# Patient Record
Sex: Female | Born: 1970 | Race: White | Hispanic: No | Marital: Married | Smoking: Never smoker
Health system: Southern US, Community
[De-identification: ages and names within clinical notes are randomized; demographics above are authoritative.]

## PROBLEM LIST (undated history)

## (undated) DIAGNOSIS — H04123 Dry eye syndrome of bilateral lacrimal glands: Secondary | ICD-10-CM

## (undated) DIAGNOSIS — L718 Other rosacea: Secondary | ICD-10-CM

## (undated) DIAGNOSIS — T7840XA Allergy, unspecified, initial encounter: Secondary | ICD-10-CM

## (undated) DIAGNOSIS — F329 Major depressive disorder, single episode, unspecified: Secondary | ICD-10-CM

## (undated) DIAGNOSIS — K222 Esophageal obstruction: Secondary | ICD-10-CM

## (undated) DIAGNOSIS — L719 Rosacea, unspecified: Secondary | ICD-10-CM

## (undated) DIAGNOSIS — F32A Depression, unspecified: Secondary | ICD-10-CM

## (undated) DIAGNOSIS — A64 Unspecified sexually transmitted disease: Secondary | ICD-10-CM

## (undated) DIAGNOSIS — C4491 Basal cell carcinoma of skin, unspecified: Secondary | ICD-10-CM

## (undated) HISTORY — DX: Basal cell carcinoma of skin, unspecified: C44.91

## (undated) HISTORY — DX: Rosacea, unspecified: L71.9

## (undated) HISTORY — PX: BASAL CELL CARCINOMA EXCISION: SHX1214

## (undated) HISTORY — DX: Depression, unspecified: F32.A

## (undated) HISTORY — DX: Unspecified sexually transmitted disease: A64

## (undated) HISTORY — DX: Other rosacea: L71.8

## (undated) HISTORY — DX: Esophageal obstruction: K22.2

## (undated) HISTORY — DX: Major depressive disorder, single episode, unspecified: F32.9

## (undated) HISTORY — DX: Dry eye syndrome of bilateral lacrimal glands: H04.123

## (undated) HISTORY — DX: Allergy, unspecified, initial encounter: T78.40XA

## (undated) HISTORY — PX: OTHER SURGICAL HISTORY: SHX169

---

## 1997-02-22 HISTORY — PX: LEEP: SHX91

## 2010-08-25 ENCOUNTER — Ambulatory Visit (INDEPENDENT_AMBULATORY_CARE_PROVIDER_SITE_OTHER): Payer: Commercial Managed Care - PPO | Admitting: Family Medicine

## 2010-08-25 DIAGNOSIS — G2589 Other specified extrapyramidal and movement disorders: Secondary | ICD-10-CM

## 2010-08-25 DIAGNOSIS — S238XXA Sprain of other specified parts of thorax, initial encounter: Secondary | ICD-10-CM

## 2010-08-25 DIAGNOSIS — M24119 Other articular cartilage disorders, unspecified shoulder: Secondary | ICD-10-CM

## 2010-08-25 DIAGNOSIS — M948X9 Other specified disorders of cartilage, unspecified sites: Secondary | ICD-10-CM

## 2010-08-25 DIAGNOSIS — R279 Unspecified lack of coordination: Secondary | ICD-10-CM

## 2010-08-25 DIAGNOSIS — S239XXA Sprain of unspecified parts of thorax, initial encounter: Secondary | ICD-10-CM

## 2010-08-25 NOTE — Progress Notes (Signed)
  Subjective:    Patient ID: Bethany Oliver, female    DOB: [birth year], 40 y.o.   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 But now sx are different and more localized to rhomboids.   States she has flares of pain in bilat trapezius that usually resolve within 2 weeks with massage therapy, icy hot, and rest.  The current flare started after a 6 mile run during which she tried to change her running gait to forefoot strike.  The pain is further down in her left scapula and rhomboid in this exacerbation.  She is not having numbness or tingling in arms, but does describe tingling in left foot.  No low back or buttock pain. Using tylenol and ibuprofen which is helpful if she takes it regularly.    PMH, PSH n/c  Review of Systems  REVIEW OF SYSTEMS  GEN: No fevers, chills. Nontoxic. Primarily MSK c/o today. MSK: Detailed in the HPI GI: tolerating PO intake without difficulty Neuro: No numbness, parasthesias, or tingling associated. Otherwise the pertinent positives of the ROS are noted above.     Objective:   Physical Exam      Physical Exam  Blood pressure 116/82, height 5\' 4"  (1.626 m), weight 125 lb (56.7 kg).  GEN: Well-developed,well-nourished,in no acute distress; alert,appropriate and cooperative throughout examination HEENT: Normocephalic and atraumatic without obvious abnormalities. Ears, externally no deformities PULM: Breathing comfortably in no respiratory distress EXT: No clubbing, cyanosis, or edema PSYCH: Normally interactive. Cooperative during the interview. Pleasant. Friendly and conversant. Not anxious or depressed appearing. Normal, full affect.  CERVICAL SPINE EXAM Range of motion: Flexion, extension, lateral bending, and rotation: Full Pain with terminal motion: none Spinous Processes: NT SCM: NT Upper paracervical muscles: NT Upper traps:  NT C5-T1 intact, sensation and motor  TTP at parascapular edge and rhomboids, L > R Snapping L scapula  Bilat shoulder exam ROM full Hawkins neg Empty can neg ER and IR strong Push off test neg Neer's neg  Spurling's neg   Assessment & Plan:  Assessment and Plan: 1.  Scapular dyskinesis with forward displaced scapula Pain at L > R rhomboids Snapping scapula  Plan: >30 minutes spent in face to face time with patient, >50% spent in counselling or coordination of care: anatomy reviewed. Reviewed scapular stabilization training and rehab for patient. Retrain posture and mechanics  If she is having difficulty managing her rehab, then PT referral is not unreasonable to assist with HEP  Recheck 1 mo

## 2010-08-26 DIAGNOSIS — S238XXA Sprain of other specified parts of thorax, initial encounter: Secondary | ICD-10-CM | POA: Insufficient documentation

## 2010-08-26 DIAGNOSIS — M24119 Other articular cartilage disorders, unspecified shoulder: Secondary | ICD-10-CM | POA: Insufficient documentation

## 2010-08-26 DIAGNOSIS — G2589 Other specified extrapyramidal and movement disorders: Secondary | ICD-10-CM | POA: Insufficient documentation

## 2010-08-26 DIAGNOSIS — M24819 Other specific joint derangements of unspecified shoulder, not elsewhere classified: Secondary | ICD-10-CM | POA: Insufficient documentation

## 2010-09-22 ENCOUNTER — Ambulatory Visit: Payer: Commercial Managed Care - PPO | Admitting: Family Medicine

## 2010-12-03 ENCOUNTER — Ambulatory Visit (INDEPENDENT_AMBULATORY_CARE_PROVIDER_SITE_OTHER): Payer: Commercial Managed Care - PPO | Admitting: Family Medicine

## 2010-12-03 ENCOUNTER — Encounter: Payer: Self-pay | Admitting: Family Medicine

## 2010-12-03 DIAGNOSIS — G2589 Other specified extrapyramidal and movement disorders: Secondary | ICD-10-CM

## 2010-12-03 DIAGNOSIS — R279 Unspecified lack of coordination: Secondary | ICD-10-CM

## 2010-12-03 DIAGNOSIS — S238XXA Sprain of other specified parts of thorax, initial encounter: Secondary | ICD-10-CM

## 2010-12-03 DIAGNOSIS — S239XXA Sprain of unspecified parts of thorax, initial encounter: Secondary | ICD-10-CM

## 2010-12-03 NOTE — Progress Notes (Signed)
  Subjective:    Patient ID: Bethany Oliver, female    DOB: November 19, 1970, 40 y.o.   MRN: [id number]  HPI  Bethany Oliver, a 40 y.o. female presents today in the office for the following:    Pleasant female, 40 year old, f/u with thoracic pain and trap/rhomboid pain with some scapular dysfunction. Did do some of the rehab that we reviewed, felt like she was doing a lot better. Now having some pain with running in this region, but that is better than before. 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   The PMH, PSH, Social History, Family History, Medications, and allergies have been reviewed in [facility], and have been updated if relevant.   Review of Systems REVIEW OF SYSTEMS  GEN: No fevers, chills. Nontoxic. Primarily MSK c/o today. MSK: Detailed in the HPI GI: tolerating PO intake without difficulty Neuro: No numbness, parasthesias, or tingling associated. Otherwise the pertinent positives of the ROS are noted above.      Objective:   Physical Exam   Physical Exam  Blood pressure 113/73, pulse 54.  GEN: Well-developed,well-nourished,in no acute distress; alert,appropriate and cooperative throughout examination HEENT: Normocephalic and atraumatic without obvious abnormalities. Ears, externally no deformities PULM: Breathing comfortably in no respiratory distress EXT: No clubbing, cyanosis, or edema PSYCH: Normally interactive. Cooperative during the interview. Pleasant. Friendly and conversant. Not anxious or depressed appearing. Normal, full affect.  Shoulder: B Inspection: No muscle wasting or winging Ecchymosis/edema: neg  AC joint, scapula, clavicle: NT Abduction: full, 5/5 Flexion: full, 5/5 IR, full, lift-off: 5/5 ER at neutral: full, 5/5 AC crossover and compression: neg Neer: neg Hawkins: neg Drop Test: neg Empty Can: neg Supraspinatus insertion: NT Bicipital groove: NT Speed's: neg Yergason's: neg Sulcus sign: neg Scapular dyskinesis:  none C5-T1 intact Sensation intact Grip 5/5       Assessment & Plan:   1. Scapular dyskinesis   2. Sprain of rhomboid    Improved, but could benefit from formal pt Discussed prop shoulder rehab scap rehab

## 2010-12-21 ENCOUNTER — Ambulatory Visit: Payer: 59 | Attending: Family Medicine

## 2010-12-21 DIAGNOSIS — M542 Cervicalgia: Secondary | ICD-10-CM | POA: Insufficient documentation

## 2010-12-21 DIAGNOSIS — M546 Pain in thoracic spine: Secondary | ICD-10-CM | POA: Insufficient documentation

## 2010-12-21 DIAGNOSIS — IMO0001 Reserved for inherently not codable concepts without codable children: Secondary | ICD-10-CM | POA: Insufficient documentation

## 2010-12-23 ENCOUNTER — Encounter: Payer: Commercial Managed Care - PPO | Admitting: Physical Therapy

## 2010-12-29 ENCOUNTER — Ambulatory Visit: Payer: 59 | Attending: Family Medicine | Admitting: Physical Therapy

## 2010-12-29 DIAGNOSIS — M542 Cervicalgia: Secondary | ICD-10-CM | POA: Insufficient documentation

## 2010-12-29 DIAGNOSIS — M546 Pain in thoracic spine: Secondary | ICD-10-CM | POA: Insufficient documentation

## 2010-12-29 DIAGNOSIS — IMO0001 Reserved for inherently not codable concepts without codable children: Secondary | ICD-10-CM | POA: Insufficient documentation

## 2010-12-31 ENCOUNTER — Ambulatory Visit: Payer: 59 | Admitting: Physical Therapy

## 2011-01-05 ENCOUNTER — Ambulatory Visit: Payer: 59

## 2011-01-06 ENCOUNTER — Ambulatory Visit: Payer: 59 | Admitting: Physical Therapy

## 2011-01-20 ENCOUNTER — Encounter: Payer: Commercial Managed Care - PPO | Admitting: Physical Therapy

## 2011-05-24 HISTORY — PX: LASIK: SHX215

## 2011-08-19 ENCOUNTER — Encounter (HOSPITAL_COMMUNITY): Payer: Self-pay | Admitting: Emergency Medicine

## 2011-08-19 ENCOUNTER — Emergency Department (HOSPITAL_COMMUNITY): Payer: Commercial Managed Care - PPO

## 2011-08-19 ENCOUNTER — Emergency Department (HOSPITAL_COMMUNITY)
Admission: EM | Admit: 2011-08-19 | Discharge: 2011-08-19 | Disposition: A | Discharge status: Home / Self Care | Payer: Commercial Managed Care - PPO | Attending: Emergency Medicine | Admitting: Emergency Medicine

## 2011-08-19 DIAGNOSIS — O039 Complete or unspecified spontaneous abortion without complication: Secondary | ICD-10-CM | POA: Insufficient documentation

## 2011-08-19 LAB — ABO/RH: ABO/RH(D): A POS

## 2011-08-19 LAB — HCG, QUANTITATIVE, PREGNANCY: hCG, Beta Chain, Quant, S: 13748 m[IU]/mL — ABNORMAL HIGH (ref <5)

## 2011-08-19 NOTE — ED Notes (Signed)
Patient transported to Ultrasound 

## 2011-08-19 NOTE — ED Provider Notes (Signed)
History     CSN: [id number]  Arrival date & time 09/05/11  0343   First MD Initiated Contact with Patient 09/05/11 0454      Chief Complaint  Patient presents with  . Vaginal Bleeding    (Consider location/radiation/quality/duration/timing/severity/associated sxs/prior treatment) HPI This is a 41 year old white female approximately [redacted] weeks pregnant. She has a known fetal demise as determined by serial ultrasounds. She is here with vaginal bleeding that began 2 days ago as spotting but became heavier than a regular period about 2:00 this morning. She has also passed a clot and was having severe pelvic cramping accompanying the heavy bleeding. The pain is subsided but he continues to bleed.  History reviewed. No pertinent past medical history.  Past Surgical History  Procedure Date  . Lasik 05/2011    No family history on file.  History  Substance Use Topics  . Smoking status: Never Smoker   . Smokeless tobacco: Never Used  . Alcohol Use: No    OB History    Grav Para Term Preterm Abortions TAB SAB Ect Mult Living                  Review of Systems  All other systems reviewed and are negative.    Allergies  Review of patient's allergies indicates no known allergies.  Home Medications   Current Outpatient Rx  Name Route Sig Dispense Refill  . BUPROPION HCL ER (XL) 150 MG PO TB24 Oral Take 150 mg by mouth daily.      . CYCLOSPORINE 0.05 % OP EMUL Both Eyes Place 1 drop into both eyes 2 (two) times daily.      BP 119/64  Pulse 60  Resp 16  SpO2 100%  LMP 06/14/2011  Physical Exam General: Well-developed, well-nourished female in no acute distress; appearance consistent with age of record HENT: normocephalic, atraumatic Eyes: pupils equal round and reactive to light; extraocular muscles intact Neck: supple Heart: regular rate and rhythm Lungs: clear to auscultation bilaterally Abdomen: soft; nondistended; suprapubic tenderness; no masses or  hepatosplenomegaly; bowel sounds present GU: Normal external genitalia; blood and tissue in vaginal vault; tissue in cervical os which is about fingertip patent; cervical motion tenderness present Extremities: No deformity; full range of motion; pulses normal Neurologic: Awake, alert and oriented; motor function intact in all extremities and symmetric; no facial droop Skin: Warm and dry Psychiatric: Normal mood and affect    ED Course  Procedures (including critical care time)  Apparent products of conception removed from the vagina with ring forceps and sent to laboratory for histopathology.   MDM   Nursing notes and vitals signs, including pulse oximetry, reviewed.  Summary of this visit's results, reviewed by myself:  Labs:  Results for orders placed during the hospital encounter of 09-05-2011  HCG, QUANTITATIVE, PREGNANCY      Component Value Range   hCG, Beta Chain, Quant, S 13748 (*) <5 mIU/mL  ABO/RH      Component Value Range   ABO/RH(D) A POS      Imaging Studies: US Ob Comp Less 14 Wks  09/05/11  *RADIOLOGY REPORT*  Clinical Data: Evaluate for a miscarriage.  OBSTETRIC <14 WK [place] AND TRANSVAGINAL OB US  Technique:  Both transabdominal and transvaginal ultrasound examinations were performed for complete evaluation of the gestation as well as the maternal uterus, adnexal regions, and pelvic cul-de-sac.  Transvaginal technique was performed to assess early pregnancy.  Comparison:  None.  Intrauterine gestational sac:  None Yolk sac:  None Embryo: None  Maternal uterus/adnexae: There is no evidence for an intrauterine gestational sac.  There may be a trace amount of endometrial fluid near the lower uterine segment.  The uterus measures 11.5 x 5.8 x 4.5 cm.  Endometrial stripe measures 1.5 cm.  There is a heterogeneous structure along the anterior uterus that measures 2.1 x 2.0 x 2.2 cm.  Findings are suggestive for a fibroid.  The right ovary has a normal appearance and measures  2.5 x 1.7 x 2.0 cm.  Normal appearance of the left ovary, measuring 3.7 x 2.2 x 2.5 cm.  No evidence of free fluid.  IMPRESSION: No evidence for an intrauterine pregnancy.  Uterine fibroid.  Original Report Authenticated By: Richarda Overlie, M.D.   US Ob Transvaginal  08/19/2011  *RADIOLOGY REPORT*  Clinical Data: Evaluate for a miscarriage.  OBSTETRIC <14 WK [place] AND TRANSVAGINAL OB US  Technique:  Both transabdominal and transvaginal ultrasound examinations were performed for complete evaluation of the gestation as well as the maternal uterus, adnexal regions, and pelvic cul-de-sac.  Transvaginal technique was performed to assess early pregnancy.  Comparison:  None.  Intrauterine gestational sac:  None Yolk sac: None Embryo: None  Maternal uterus/adnexae: There is no evidence for an intrauterine gestational sac.  There may be a trace amount of endometrial fluid near the lower uterine segment.  The uterus measures 11.5 x 5.8 x 4.5 cm.  Endometrial stripe measures 1.5 cm.  There is a heterogeneous structure along the anterior uterus that measures 2.1 x 2.0 x 2.2 cm.  Findings are suggestive for a fibroid.  The right ovary has a normal appearance and measures 2.5 x 1.7 x 2.0 cm.  Normal appearance of the left ovary, measuring 3.7 x 2.2 x 2.5 cm.  No evidence of free fluid.  IMPRESSION: No evidence for an intrauterine pregnancy.  Uterine fibroid.  Original Report Authenticated By: Richarda Overlie, M.D.   7:36 AM Patient advised that physical findings as well as ultrasound findings consistent with completed miscarriage. She was advised to follow up with Dr. Seymour Bars.        Hanley Seamen, MD 08/19/11 [phone]

## 2011-08-19 NOTE — ED Notes (Signed)
Per family, pt had U/S a few days ago, known no fetal movement, no heart beat, miscarriage expected

## 2011-08-19 NOTE — ED Notes (Addendum)
Pt alert, arrives from home, c/o vaginal bleeding, lower abd pain, hx [redacted] weeks pregnant, spouse MD, resp even unlabored, skin pwd, states seen OB last week

## 2011-08-19 NOTE — Discharge Instructions (Signed)
Miscarriage (Spontaneous Miscarriage)  A miscarriage is when you lose your baby before the twentieth week of pregnancy. Miscarriages happen in 15-20% of pregnancies. Most miscarriages happen in the first 13 weeks of the pregnancy. In medical terms, this is called a spontaneous miscarriage or early pregnancy loss. No further treatment is needed when the miscarriage is complete and all products of conception have been passed out of the body. You can begin trying for another pregnancy as soon as your caregiver says it is okay.  CAUSES    Most causes are not known.   Genetic problems like abnormal, not enough or too many chromosomes.   Infection of the cervix or uterus.   An abnormal shaped uterus, fibroid tumors or congenital abnormalities.   Hormone problems.   Medical problems.   Incompetent cervix, the tissue in the cervix is not strong enough to hold the pregnancy.   Smoking, too much alcohol use and illegal drugs.   Trauma.  SYMPTOMS    Bleeding or spotting from the vagina.   Cramping of the lower abdomen.   Passing of fluid from the vagina with or without cramps or pain.   Passing fetal tissue.  TREATMENT    Sometimes no further treatment is necessary if you pass all the tissue in the uterus.   If partial parts of the fetus or placenta remain in the body (incomplete miscarriage), tissue left behind may become infected. Usually a D and C (Dilatation and Curettage) suction or scrapping of the uterus is necessary to remove the remaining tissue in uterus. The procedure is only done when your caregiver knows that there is no chance for the pregnancy to continue. This is determined by a physical exam, a negative pregnancy test, blood tests and perhaps an ultrasound revealing a dead fetus or no fetus developing because a problem occurred at conception (when the sperm and egg unite).   Medications may be necessary, antibiotics if there is an infection or medications to contract the uterus if there is a  lot of bleeding.   If you have Rh negative blood and your partner is Rh positive, you will need a Rho-gam shot (an immune globulin vaccine). This will protect your baby from having Rh blood problems in future pregnancies.  HOME CARE INSTRUCTIONS    Your caregiver may order bed rest (up to the bathroom only). He or she may allow you to continue light activity. You may need to make arrangements for the care of children and for any other responsibilities.   Keep track of the number of pads you use each day and how soaked (saturated) they are. Record this information.   Do not use tampons. Do not douche or have sexual intercourse until approved by your caregiver.   Only take over-the-counter or prescription medicines for pain, discomfort or fever as directed by your caregiver.   Do not take aspirin because it can cause bleeding.   It is very important to keep all follow-up appointments for re-evaluations and continuing management.   Tell your caregiver if you are experiencing domestic violence.   Women who have an Rh negative blood type (i.e., A, B, AB, or O negative) need to receive a drug called Rh(D) immune globulin (RhoGam). This medicine helps protect future fetuses against problems that can occur if an Rh negative mother is carrying a baby who is Rh positive.   If you and/or your partner are having problems with guilt or grieving, talk to your caregiver or seek counseling to help   you cope with the pregnancy loss. Allow enough time to grieve before trying to get pregnant again.  SEEK IMMEDIATE MEDICAL CARE IF:    You have severe cramps or pain in your stomach, back, or belly (abdomen).   You have a fever.   You pass large clots or tissue. Save any tissue for your caregiver to inspect.   Your bleeding increases.   You become light-headed, weak or have fainting episodes.   You develop chills.  Document Released: 08/04/2000 Document Revised: 01/28/2011 Document Reviewed: 09/11/2007  [employer] Patient  Information 2012 [employer].

## 2012-09-18 ENCOUNTER — Other Ambulatory Visit (HOSPITAL_COMMUNITY): Payer: Self-pay | Admitting: Obstetrics & Gynecology

## 2012-09-18 DIAGNOSIS — Z1231 Encounter for screening mammogram for malignant neoplasm of breast: Secondary | ICD-10-CM

## 2012-09-25 ENCOUNTER — Ambulatory Visit (HOSPITAL_COMMUNITY): Payer: 59 | Attending: Obstetrics & Gynecology

## 2012-10-12 ENCOUNTER — Ambulatory Visit (HOSPITAL_COMMUNITY)
Admission: RE | Admit: 2012-10-12 | Discharge: 2012-10-12 | Disposition: A | Discharge status: Home / Self Care | Payer: 59 | Source: Ambulatory Visit | Attending: Obstetrics & Gynecology | Admitting: Obstetrics & Gynecology

## 2012-10-12 ENCOUNTER — Other Ambulatory Visit (HOSPITAL_COMMUNITY): Payer: Self-pay | Admitting: Obstetrics & Gynecology

## 2012-10-12 DIAGNOSIS — Z1231 Encounter for screening mammogram for malignant neoplasm of breast: Secondary | ICD-10-CM | POA: Insufficient documentation

## 2012-11-22 LAB — HM MAMMOGRAPHY: HM MAMMO: NORMAL

## 2013-04-23 ENCOUNTER — Ambulatory Visit: Payer: 59 | Admitting: Family Medicine

## 2013-04-30 ENCOUNTER — Ambulatory Visit: Payer: 59 | Admitting: Family Medicine

## 2013-05-16 ENCOUNTER — Encounter: Payer: Self-pay | Admitting: Family Medicine

## 2013-05-16 ENCOUNTER — Encounter (INDEPENDENT_AMBULATORY_CARE_PROVIDER_SITE_OTHER): Payer: Self-pay

## 2013-05-16 ENCOUNTER — Ambulatory Visit (INDEPENDENT_AMBULATORY_CARE_PROVIDER_SITE_OTHER): Payer: 59 | Admitting: Family Medicine

## 2013-05-16 VITALS — BP 94/68 | Temp 98.6°F | Ht 63.5 in | Wt 123.0 lb

## 2013-05-16 DIAGNOSIS — Z7189 Other specified counseling: Secondary | ICD-10-CM

## 2013-05-16 DIAGNOSIS — Z7689 Persons encountering health services in other specified circumstances: Secondary | ICD-10-CM

## 2013-05-16 DIAGNOSIS — F3289 Other specified depressive episodes: Secondary | ICD-10-CM

## 2013-05-16 DIAGNOSIS — Z23 Encounter for immunization: Secondary | ICD-10-CM

## 2013-05-16 DIAGNOSIS — F32A Depression, unspecified: Secondary | ICD-10-CM

## 2013-05-16 DIAGNOSIS — F329 Major depressive disorder, single episode, unspecified: Secondary | ICD-10-CM

## 2013-05-16 LAB — LIPID PANEL
CHOLESTEROL: 182 mg/dL (ref 0–200)
HDL: 54.5 mg/dL (ref >39.00)
LDL Cholesterol: 116 mg/dL — ABNORMAL HIGH (ref 0–99)
Total CHOL/HDL Ratio: 3
Triglycerides: 56 mg/dL (ref 0.0–149.0)
VLDL: 11.2 mg/dL (ref 0.0–40.0)

## 2013-05-16 LAB — HEMOGLOBIN A1C: Hgb A1c MFr Bld: 5.4 % (ref 4.6–6.5)

## 2013-05-16 MED ORDER — BUPROPION HCL ER (XL) 150 MG PO TB24: 150.0000 mg | ORAL_TABLET | Freq: Every day | ORAL | Status: DC

## 2013-05-16 NOTE — Progress Notes (Signed)
Chief Complaint  Patient presents with  . Establish Care    HPI:  Bethany Oliver is here to establish care.  Prior PCP left practice - Dr. Quay Burow with [facility]. Last PCP and physical: sees Dr. Dellis Filbert - utd on paps and physical - had labs done in 01/3012 and all normal.  Has the following chronic problems and concerns today:  Patient Active Problem List   Diagnosis Date Noted  . 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 Sprain of rhomboid 08/26/2010  . 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  Past Medical History  Diagnosis Date  . Allergy   . Depression     mild, no hospitalization  . Dry eyes     Family History  Problem Relation Age of Onset  . Hypertension Mother   . Parkinson's disease Father   . Hyperlipidemia Father   . Mental illness Father   . Heart disease Maternal Grandmother   . Diabetes Maternal Grandfather   . Heart disease Maternal Grandfather     History   Social History  . Marital Status: Married    Spouse Name: N/A    Number of Children: N/A  . Years of Education: N/A   Social History Main Topics  . Smoking status: Never Smoker   . Smokeless tobacco: Never Used  . Alcohol Use: Yes     Comment: occ  . Drug Use: None  . Sexual Activity: None   Other Topics Concern  . 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 and adopted child 2yo in 2015      Spiritual Beliefs: catholic      Lifestyle: does get regular CV exercise; diet is good             Current outpatient prescriptions:buPROPion (WELLBUTRIN XL) 150 MG 24 hr tablet, Take 1 tablet (150 mg total) by mouth daily., Disp: 90 tablet, Rfl: 3;  cycloSPORINE (RESTASIS) 0.05 % ophthalmic emulsion, Place 1 drop into both eyes 2 (two) times daily., Disp: , Rfl:   EXAM:  Filed Vitals:   05/16/13 0941  BP: 94/68  Temp: 98.6 F (37 C)    Body mass index is 21.44 kg/(m^2).  GENERAL: vitals reviewed and listed above, alert, oriented, appears well hydrated and in no acute distress  HEENT: atraumatic, conjunttiva clear, no obvious abnormalities on inspection of external nose and ears  NECK: no obvious masses on inspection  LUNGS: clear to auscultation bilaterally, no wheezes, rales or rhonchi, good air movement  CV: HRRR, no peripheral edema  MS: moves all extremities without noticeable abnormality  PSYCH: pleasant and cooperative, no obvious depression or anxiety  ASSESSMENT AND PLAN:  Discussed the following assessment and plan:  Depression - Plan: buPROPion (WELLBUTRIN XL) 150 MG 24 hr tablet  Encounter to establish care - Plan: Lipid Panel, Hemoglobin A1c  -We reviewed the PMH, PSH, FH, SH, Meds and Allergies. -We provided refills for any medications we will prescribe as needed. -We addressed current concerns per orders and patient instructions. -We have asked for records for pertinent exams, studies, vaccines and notes from previous providers. -We have advised patient to follow up per instructions below. -NON-FASTING labs -refilled Wellbutrin -tdap today -follow up 6-12 months    -Patient advised to return or notify a doctor immediately if symptoms worsen or  persist or new concerns arise.  Patient Instructions  -We have ordered labs or studies at this visit. It can take up to 1-2 weeks for results and processing. We will contact you with instructions IF your results are abnormal. Normal results will be released to your [facility]. If you have not heard from [place] or can not find your results in [facility] in 2 weeks please contact our office.  -PLEASE SIGN UP FOR MYCHART TODAY   We recommend the following healthy lifestyle measures: - eat a healthy diet consisting of lots of vegetables, fruits, beans, nuts, seeds, healthy meats such as white chicken and fish  and whole grains.  - avoid fried foods, fast food, processed foods, sodas, red meet and other fattening foods.  - get a least 150 minutes of aerobic exercise per week.   Follow up in: in 6 - 12 months      KIM, [facility] R.

## 2013-05-16 NOTE — Progress Notes (Signed)
Pre visit review using our clinic review tool, if applicable. No additional management support is needed unless otherwise documented below in the visit note. 

## 2013-05-16 NOTE — Patient Instructions (Signed)
-  We have ordered labs or studies at this visit. It can take up to 1-2 weeks for results and processing. We will contact you with instructions IF your results are abnormal. Normal results will be released to your [facility]. If you have not heard from [place] or can not find your results in [facility] in 2 weeks please contact our office.  -PLEASE SIGN UP FOR MYCHART TODAY   We recommend the following healthy lifestyle measures: - eat a healthy diet consisting of lots of vegetables, fruits, beans, nuts, seeds, healthy meats such as white chicken and fish and whole grains.  - avoid fried foods, fast food, processed foods, sodas, red meet and other fattening foods.  - get a least 150 minutes of aerobic exercise per week.   Follow up in: in 6 - 12 months

## 2013-05-16 NOTE — Addendum Note (Signed)
Addended by: Colleen Can on: 05/16/2013 10:31 AM   Modules accepted: Orders

## 2013-10-15 ENCOUNTER — Other Ambulatory Visit (HOSPITAL_COMMUNITY): Payer: Self-pay | Admitting: Obstetrics & Gynecology

## 2013-10-15 DIAGNOSIS — Z1231 Encounter for screening mammogram for malignant neoplasm of breast: Secondary | ICD-10-CM

## 2013-11-06 ENCOUNTER — Ambulatory Visit (HOSPITAL_COMMUNITY): Payer: 59 | Attending: Obstetrics & Gynecology

## 2013-11-15 ENCOUNTER — Ambulatory Visit (HOSPITAL_COMMUNITY)
Admission: RE | Admit: 2013-11-15 | Discharge: 2013-11-15 | Disposition: A | Discharge status: Home / Self Care | Payer: 59 | Source: Ambulatory Visit | Attending: Obstetrics & Gynecology | Admitting: Obstetrics & Gynecology

## 2013-11-15 ENCOUNTER — Other Ambulatory Visit (HOSPITAL_COMMUNITY): Payer: Self-pay | Admitting: Obstetrics & Gynecology

## 2013-11-15 DIAGNOSIS — Z1231 Encounter for screening mammogram for malignant neoplasm of breast: Secondary | ICD-10-CM

## 2013-11-20 ENCOUNTER — Other Ambulatory Visit: Payer: Self-pay | Admitting: Obstetrics & Gynecology

## 2013-11-20 DIAGNOSIS — R928 Other abnormal and inconclusive findings on diagnostic imaging of breast: Secondary | ICD-10-CM

## 2013-12-04 ENCOUNTER — Ambulatory Visit
Admission: RE | Admit: 2013-12-04 | Discharge: 2013-12-04 | Disposition: A | Discharge status: Home / Self Care | Payer: 59 | Source: Ambulatory Visit | Attending: Obstetrics & Gynecology | Admitting: Obstetrics & Gynecology

## 2013-12-04 DIAGNOSIS — R928 Other abnormal and inconclusive findings on diagnostic imaging of breast: Secondary | ICD-10-CM

## 2014-02-25 ENCOUNTER — Ambulatory Visit (INDEPENDENT_AMBULATORY_CARE_PROVIDER_SITE_OTHER): Payer: 59 | Admitting: Family Medicine

## 2014-02-25 ENCOUNTER — Encounter: Payer: Self-pay | Admitting: Family Medicine

## 2014-02-25 VITALS — BP 92/62 | HR 83 | Temp 98.3°F | Ht 63.5 in | Wt 124.0 lb

## 2014-02-25 DIAGNOSIS — J019 Acute sinusitis, unspecified: Secondary | ICD-10-CM

## 2014-02-25 MED ORDER — FLUCONAZOLE 150 MG PO TABS: 150.0000 mg | ORAL_TABLET | Freq: Once | ORAL | Status: DC

## 2014-02-25 MED ORDER — AMOXICILLIN-POT CLAVULANATE 875-125 MG PO TABS: 1.0000 | ORAL_TABLET | Freq: Two times a day (BID) | ORAL | Status: DC

## 2014-02-25 NOTE — Progress Notes (Signed)
HPI:  -started: 3 weeks ago -symptoms:nasal congestion, sore throat, cough - now with L maxillary sinus pain worse with leaning forawrd and thick green congestion from this side -denies:fever, SOB, NVD -has tried: sudafed and tylenol -sick contacts/travel/risks: denies flu exposure, tick exposure or or Ebola risks -Hx of: allergies , hx of sinus infection - 5 years ago and this feels the same  ROS: See pertinent positives and negatives per HPI.  Past Medical History  Diagnosis Date  . Allergy   . Depression     mild, no hospitalization  . Dry eyes     Past Surgical History  Procedure Laterality Date  . Lasik  05/2011  . Leep  1999  . Invetro    . Ivf  [id number]    Family History  Problem Relation Age of Onset  . Hypertension Mother   . Parkinson's disease Father   . Hyperlipidemia Father   . Mental illness Father   . Heart disease Maternal Grandmother   . Diabetes Maternal Grandfather   . Heart disease Maternal Grandfather     History   Social History  . Marital Status: Married    Spouse Name: N/A    Number of Children: N/A  . Years of Education: N/A   Social History Main Topics  . Smoking status: Never Smoker   . Smokeless tobacco: Never Used  . Alcohol Use: Yes     Comment: occ  . Drug Use: None  . Sexual Activity: None   Other Topics Concern  . 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 and adopted child 2yo in 2015      Spiritual Beliefs: catholic      Lifestyle: does get regular CV exercise; diet is good             Current outpatient prescriptions: buPROPion (WELLBUTRIN XL) 150 MG 24 hr tablet, Take 1 tablet (150 mg total) by mouth daily., Disp: 90 tablet, Rfl: 3;  amoxicillin-clavulanate (AUGMENTIN) 875-125 MG per tablet, Take 1 tablet by mouth 2 (two) times daily., Disp: 20 tablet, Rfl: 0;  fluconazole (DIFLUCAN) 150 MG  tablet, Take 1 tablet (150 mg total) by mouth once., Disp: 1 tablet, Rfl: 0  EXAM:  Filed Vitals:   02/25/14 1519  BP: 92/62  Pulse: 83  Temp: 98.3 F (36.8 C)    Body mass index is 21.62 kg/(m^2).  GENERAL: vitals reviewed and listed above, alert, oriented, appears well hydrated and in no acute distress  HEENT: atraumatic, conjunttiva clear, no obvious abnormalities on inspection of external nose and ears, normal appearance of ear canals and TMs, clear nasal congestion, boggy turbinates L > R ,  mild post oropharyngeal erythema with PND, no tonsillar edema or exudate, no sinus TTP  NECK: no obvious masses on inspection  LUNGS: clear to auscultation bilaterally, no wheezes, rales or rhonchi, good air movement  CV: HRRR, no peripheral edema  MS: moves all extremities without noticeable abnormality  PSYCH: pleasant and cooperative, no obvious depression or anxiety  ASSESSMENT AND PLAN:  Discussed the following assessment and plan:  Acute sinusitis, recurrence not specified, unspecified location - Plan: amoxicillin-clavulanate (AUGMENTIN) 875-125 MG per tablet  -Likely sinus infection. We discussed treatment side effects, likely course, antibiotic misuse, transmission, and signs of developing a serious illness. -reports yeast inf and requests diflucan for this as does no like to do  vaginal tx -of course, we advised to return or notify a doctor immediately if symptoms worsen or persist or new concerns arise.    Patient Instructions  Flonase 2 sprays daily for 21 days  Antibiotic  Follow up as needed     KIM, Jarrett Soho R.

## 2014-02-25 NOTE — Patient Instructions (Signed)
Flonase 2 sprays daily for 21 days  Antibiotic  Follow up as needed

## 2014-02-25 NOTE — Progress Notes (Signed)
Pre visit review using our clinic review tool, if applicable. No additional management support is needed unless otherwise documented below in the visit note. 

## 2014-07-01 ENCOUNTER — Other Ambulatory Visit: Payer: Self-pay | Admitting: Family Medicine

## 2014-07-31 ENCOUNTER — Telehealth: Payer: Self-pay | Admitting: Family Medicine

## 2014-07-31 NOTE — Telephone Encounter (Signed)
PLEASE NOTE: All timestamps contained within this report are represented as [place] Standard Time. CONFIDENTIALTY NOTICE: This fax transmission is intended only for the addressee. It contains information that is legally privileged, confidential or otherwise protected from use or disclosure. If you are not the intended recipient, you are strictly prohibited from reviewing, disclosing, copying using or disseminating any of this information or taking any action in reliance on or regarding this information. If you have received this fax in error, please notify us immediately by telephone so that we can arrange for its return to [place]. Phone: [phone], Toll-Free: [phone], Fax: [phone] Page: 1 of 2 Call Id: [id number] [facility] - Client [place] Patient Name: Bethany Oliver Gender: Female DOB: 05-Aug-1970 Age: 44 Y 47 M 26 D Return Phone Number: [phone] (Primary) Address: City/State/Zip: [place] Client [place] - Client Client Site [place] - Day Physician Colin Benton Contact Type Call Call Type Triage / Clinical Relationship To Patient Self Appointment Disposition EMR Appointment Not Necessary Info pasted into Epic Yes Return Phone Number [phone] (Primary) Chief Complaint Diarrhea Initial Comment Caller states c/o diarrhea, unable to eat PreDisposition Home Care Nurse Assessment Nurse: Ronnald Ramp, RN, Miranda Date/Time (Eastern Time): 07/31/2014 10:53:11 AM Confirm and document reason for call. If symptomatic, describe symptoms. ---Caller states she has been having loose stools since Friday but worse since Monday. Has cramps after she eats. Has the patient traveled out of the country within the last 30 days? ---No Does the patient require triage? ---Yes Related visit to physician within the last 2 weeks? ---No Does the PT have any chronic conditions? (i.e.  diabetes, asthma, etc.) ---Yes List chronic conditions. ---Allergies, Depression Did the patient indicate they were pregnant? ---No Guidelines Guideline Title Affirmed Question Affirmed Notes Nurse Date/Time (Eastern Time) Diarrhea Mild diarrhea (all triage questions negative) Ronnald Ramp, RN, Miranda 07/31/2014 10:54:37 AM Disp. Time Eilene Ghazi Time) Disposition Final User 07/31/2014 10:34:37 AM Send To Clinical Follow Up Rich Brave, Amy 07/31/2014 11:00:52 AM Home Care Yes Ronnald Ramp, RN, Judge Stall Understands: Yes Disagree/Comply: Comply PLEASE NOTE: All timestamps contained within this report are represented as [place] Standard Time. CONFIDENTIALTY NOTICE: This fax transmission is intended only for the addressee. It contains information that is legally privileged, confidential or otherwise protected from use or disclosure. If you are not the intended recipient, you are strictly prohibited from reviewing, disclosing, copying using or disseminating any of this information or taking any action in reliance on or regarding this information. If you have received this fax in error, please notify us immediately by telephone so that we can arrange for its return to [place]. Phone: [phone], Toll-Free: [phone], Fax: [phone] Page: 2 of 2 Call Id: [id number] Care Advice Given Per Guideline HOME CARE: You should be able to treat this at home. * In healthy adults, most new onset diarrhea is caused by a viral infection of the intestines. REASSURANCE: * Diarrhea is the body's way of getting rid of the germs. * Here are some tips on how to keep ahead of the fluid losses. FLUID THERAPY DURING MILD-MODERATE DIARRHEA: * Drink more fluids, at least 8-10 glasses (8 oz) daily. * For example: sports drinks, diluted fruit juices, soft drinks. * Supplement this with saltine crackers or soups, to make certain that you are getting sufficient fluid and salt to meet your body's needs. * Avoid caffeinated beverages  (Reason: caffeine is mildly dehydrating). NUTRITION DURING MILD-MODERATE DIARRHEA * Maintaining some  food intake during episodes of diarrhea is important. * Ideal initial foods include boiled starches / cereals (e.g., potatoes, rice, noodles, wheat, oats) with a small amount of salt to taste. * Other acceptable foods include: bananas, yogurt, crackers, soup. * As your stools return to normal consistency, resume a normal diet. OTC MEDS - Bismuth Subsalicylate (e.g., Kaopectate, Pepto-Bismol): * Helps reduce diarrhea, vomiting, and abdominal cramping. OTC MEDS - Loperamide (Imodium AD): * Helps reduce diarrhea. * Adult dosage: 4 mg (2 capsules or 4 teaspoons or 20 ml) is the recommended first dose. You may take an additional 2 mg (1 capsule or 2 teaspoons or 10 ml) after each loose BM. CAUTION - Bismuth Subsalicylate (e.g., Kaopectate, Pepto-Bismol): * May cause a temporary darkening of stool and tongue. * Be certain to wash your hands after using the restroom. CONTAGIOUSNESS: EXPECTED COURSE: Viral diarrhea lasts 4-7 days. Always worse on days 1 and 2. CALL BACK IF: * Diarrhea persists over 7 days * You become worse. CARE ADVICE given per Diarrhea (Adult) guideline. * Signs of dehydration occur (e.g., no urine over 12 hours, very dry mouth, lightheaded, etc.) After Care Instructions Given Call Event Type User Date / Time Description

## 2014-07-31 NOTE — Telephone Encounter (Signed)
Per Dr Maudie Mercury the pt can schedule an office visit tomorrow if she wants to or if she is not improving.  I called the pt and informed her of this and scheduled her for an appt tomorrow at 4pm and advised her to call to cancel in the morning if she is feeling better and she agreed.

## 2014-07-31 NOTE — Telephone Encounter (Signed)
Patient Name: Bethany Oliver DOB: 1970-12-28 Initial Comment Caller states c/o diarrhea, unable to eat Nurse Assessment Nurse: Ronnald Ramp, RN, Miranda Date/Time (Eastern Time): 07/31/2014 10:53:11 AM Confirm and document reason for call. If symptomatic, describe symptoms. ---Caller states she has been having loose stools since Friday but worse since Monday. Has cramps after she eats. Has the patient traveled out of the country within the last 30 days? ---No Does the patient require triage? ---Yes Related visit to physician within the last 2 weeks? ---No Does the PT have any chronic conditions? (i.e. diabetes, asthma, etc.) ---Yes List chronic conditions. ---Allergies, Depression Did the patient indicate they were pregnant? ---No Guidelines Guideline Title Affirmed Question Affirmed Notes Diarrhea Mild diarrhea (all triage questions negative) Final Disposition User Home Care Ronnald Ramp, RN, Jeannetta Nap

## 2014-08-01 ENCOUNTER — Ambulatory Visit (INDEPENDENT_AMBULATORY_CARE_PROVIDER_SITE_OTHER): Payer: 59 | Admitting: Family Medicine

## 2014-08-01 ENCOUNTER — Encounter: Payer: Self-pay | Admitting: Family Medicine

## 2014-08-01 VITALS — BP 108/68 | HR 82 | Temp 97.9°F | Ht 63.5 in | Wt 120.5 lb

## 2014-08-01 DIAGNOSIS — K529 Noninfective gastroenteritis and colitis, unspecified: Secondary | ICD-10-CM | POA: Diagnosis not present

## 2014-08-01 DIAGNOSIS — T7840XA Allergy, unspecified, initial encounter: Secondary | ICD-10-CM

## 2014-08-01 NOTE — Progress Notes (Signed)
HPI:  Acute visit for:  Diarrhea: -started 4 days ago with diarrhea, chills, upset stomach, anorexia, congestion, PND, some cramping - now improving, reports feels "way better today" -watery diarrhea 3 x per day -tried pepto bismol -denies: fevers, hematochezia, melena, vomiting, focal abd pain, dysuria -FDLMP: May 22, 16  Seasonal allergies: -taking flonase daily and allegra -was doing better, but now nasal congestion, PND -denies: SOB, wheezing  ROS: See pertinent positives and negatives per HPI.  Past Medical History  Diagnosis Date  . Allergy   . Depression     mild, no hospitalization  . Dry eyes     Past Surgical History  Procedure Laterality Date  . Lasik  05/2011  . Leep  1999  . Invetro    . Ivf  2008-20012    Family History  Problem Relation Age of Onset  . Hypertension Mother   . Parkinson's disease Father   . Hyperlipidemia Father   . Mental illness Father   . Heart disease Maternal Grandmother   . Diabetes Maternal Grandfather   . Heart disease Maternal Grandfather     History   Social History  . Marital Status: Married    Spouse Name: N/A  . Number of Children: N/A  . Years of Education: N/A   Social History Main Topics  . Smoking status: Never Smoker   . Smokeless tobacco: Never Used  . Alcohol Use: Yes     Comment: occ  . Drug Use: Not on file  . Sexual Activity: Not on file   Other Topics Concern  . 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  buPROPion (WELLBUTRIN XL) 150 MG 24 hr tablet, TAKE 1 TABLET BY MOUTH DAILY., Disp: 30 tablet, Rfl: 0 .  fexofenadine (ALLEGRA) 180 MG tablet, Take 180 mg by mouth daily., Disp: , Rfl:  .  fluticasone  (FLONASE) 50 MCG/ACT nasal spray, Place into both nostrils daily., Disp: , Rfl:  .  pseudoephedrine (SUDAFED) 30 MG tablet, Take 30 mg by mouth every 4 (four) hours as needed for congestion., Disp: , Rfl:   EXAM:  Filed Vitals:   08/01/14 1600  BP: 108/68  Pulse: 82  Temp: 97.9 F (36.6 C)    Body mass index is 21.01 kg/(m^2).  GENERAL: vitals reviewed and listed above, alert, oriented, appears well hydrated and in no acute distress  HEENT: atraumatic, conjunttiva clear, no obvious abnormalities on inspection of external nose and ears  NECK: no obvious masses on inspection  LUNGS: clear to auscultation bilaterally, no wheezes, rales or rhonchi, good air movement  CV: HRRR, no peripheral edema  MS: moves all extremities without noticeable abnormality  PSYCH: pleasant and cooperative, no obvious depression or anxiety  ASSESSMENT AND PLAN:  Discussed the following assessment and plan:  Gastroenteritis -likely, benign exam  -per instructions and folow up as needed  Allergic reaction, initial encounter -try switching antihistamine, INS - consider adding singulair, antihistamine  -Patient advised to return or notify a doctor immediately if symptoms worsen or persist or new concerns arise.  Patient Instructions  No dairy until 3-5 days without diarrhea  Imodium (loperamide) as needed for diarrhea  Probiotic for 1 month  nexium or zantac for 2 weeks  Try claritin and flonase daily - if allergies not improving in 3 weeks we can try adding Singulair or have you see the allergist for testing  Follow up if persists, worsens or new concerns     Willetta York R.

## 2014-08-01 NOTE — Patient Instructions (Addendum)
No dairy until 3-5 days without diarrhea  Imodium (loperamide) as needed for diarrhea  Probiotic for 1 month  nexium or zantac for 2 weeks  Try claritin and flonase daily - if allergies not improving in 3 weeks we can try adding Singulair or have you see the allergist for testing  Follow up if persists, worsens or new concerns

## 2014-08-01 NOTE — Progress Notes (Signed)
Pre visit review using our clinic review tool, if applicable. No additional management support is needed unless otherwise documented below in the visit note. 

## 2014-08-12 ENCOUNTER — Other Ambulatory Visit: Payer: Self-pay | Admitting: Family Medicine

## 2014-10-21 ENCOUNTER — Encounter: Payer: Self-pay | Admitting: Family Medicine

## 2014-10-21 ENCOUNTER — Ambulatory Visit (INDEPENDENT_AMBULATORY_CARE_PROVIDER_SITE_OTHER): Payer: 59 | Admitting: Family Medicine

## 2014-10-21 VITALS — BP 100/76 | HR 55 | Temp 98.0°F | Ht 63.75 in | Wt 127.2 lb

## 2014-10-21 DIAGNOSIS — L200 Besnier's prurigo: Secondary | ICD-10-CM

## 2014-10-21 DIAGNOSIS — F39 Unspecified mood [affective] disorder: Secondary | ICD-10-CM

## 2014-10-21 DIAGNOSIS — Z0000 Encounter for general adult medical examination without abnormal findings: Secondary | ICD-10-CM | POA: Diagnosis not present

## 2014-10-21 DIAGNOSIS — J309 Allergic rhinitis, unspecified: Secondary | ICD-10-CM | POA: Diagnosis not present

## 2014-10-21 DIAGNOSIS — L239 Allergic contact dermatitis, unspecified cause: Secondary | ICD-10-CM

## 2014-10-21 DIAGNOSIS — Z23 Encounter for immunization: Secondary | ICD-10-CM

## 2014-10-21 MED ORDER — FLUTICASONE PROPIONATE 50 MCG/ACT NA SUSP: 1.0000 | Freq: Every day | NASAL | Status: DC

## 2014-10-21 MED ORDER — BUPROPION HCL ER (XL) 150 MG PO TB24: ORAL_TABLET | ORAL | Status: DC

## 2014-10-21 MED ORDER — TRIAMCINOLONE ACETONIDE 0.1 % EX CREA: 1.0000 "application " | TOPICAL_CREAM | Freq: Two times a day (BID) | CUTANEOUS | Status: DC

## 2014-10-21 NOTE — Progress Notes (Signed)
Pre visit review using our clinic review tool, if applicable. No additional management support is needed unless otherwise documented below in the visit note. 

## 2014-10-21 NOTE — Patient Instructions (Signed)
BEFORE YOU LEAVE: -schedule lab appointment for fasting labs -follow up yearly for annual exam  -try the stronger steroid cream and let us know if this does not work  We recommend the following healthy lifestyle measures: - eat a healthy diet consisting of lots of vegetables, fruits, beans, nuts, seeds, healthy meats such as white chicken and fish and whole grains.  - avoid fried foods, fast food, processed foods, sodas, red meet and other fattening foods.  - get a least 150 minutes of aerobic exercise per week.

## 2014-10-21 NOTE — Progress Notes (Signed)
HPI:  Here for CPE:  -Concerns and/or follow up today:   Depression: -meds: wellbutrin -stable, wants refills, desires to continue for now  Allergic Rhinitis: -meds: anthistamine, flonase -stable -reports flonase is cheaper with rx and want refill on this  Rash: -started after new sunscreen and detergent exposures, mainly in creases of extremities and extermities - itchy and fine vesiculopapular -denies sob, lesions on face, pain  -Diet: variety of foods, balance and well rounded  -Exercise: regular exercise  -Taking folic acid, vitamin D or calcium: no  -Diabetes and Dyslipidemia Screening: not fasting, but wants to do  -Hx of HTN: no  -Vaccines: UTD  -pap history: done with gyn, yearly in september  -FDLMP: see CNA notes  -sexual activity: yes, female partner, no new partners  -wants STI testing (Hep C if born [birth year]): no  -FH breast, colon or ovarian ca: see FH Last mammogram: does with gyn Last colon cancer screening: n/a  Breast Ca Risk Assessment: -does breast health with gyn   -Alcohol, Tobacco, drug use: see social history  Review of Systems - no fevers, unintentional weight loss, vision loss, hearing loss, chest pain, sob, hemoptysis, melena, hematochezia, hematuria, genital discharge, changing or concerning skin lesions, bleeding, bruising, loc, thoughts of self harm or SI  Past Medical History  Diagnosis Date  . Allergy   . Depression     mild, no hospitalization  . Dry eyes     Past Surgical History  Procedure Laterality Date  . Lasik  05/2011  . Leep  1999  . Invetro    . Ivf  2008-20012    Family History  Problem Relation Age of Onset  . Hypertension Mother   . Parkinson's disease Father   . Hyperlipidemia Father   . Mental illness Father   . Heart disease Maternal Grandmother   . Diabetes Maternal Grandfather   . Heart disease Maternal Grandfather     Social History   Social History  . Marital Status: Married    Spouse  Name: N/A  . Number of Children: N/A  . Years of Education: N/A   Social History Main Topics  . Smoking status: Never Smoker   . Smokeless tobacco: Never Used  . Alcohol Use: Yes     Comment: occ  . Drug Use: None  . Sexual Activity: Not Asked   Other Topics Concern  . 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  buPROPion (WELLBUTRIN XL) 150 MG 24 hr tablet, TAKE 1 TABLET BY MOUTH DAILY., Disp: 90 tablet, Rfl: 3 .  fexofenadine (ALLEGRA) 180 MG tablet, Take 180 mg by mouth daily., Disp: , Rfl:  .  fluticasone (FLONASE) 50 MCG/ACT nasal spray, Place 1 spray into both nostrils daily., Disp: 16 g, Rfl: 6 .  triamcinolone cream (KENALOG) 0.1 %, Apply 1 application topically 2 (two) times daily., Disp: 30 g, Rfl: 0  EXAM:  Filed Vitals:   10/21/14 0934  BP: 100/76  Pulse: 55  Temp: 98 F (36.7 C)    GENERAL: vitals reviewed and listed below, alert, oriented, appears well hydrated and in no acute  distress  HEENT: head atraumatic, PERRLA, normal appearance of eyes, ears, nose and mouth. moist mucus membranes.  NECK: supple, no masses or lymphadenopathy  LUNGS: clear to auscultation bilaterally, no rales, rhonchi or wheeze  CV: HRRR, no peripheral edema or cyanosis, normal pedal pulses  BREAST: declined - does with gyn  ABDOMEN: bowel sounds normal, soft, non tender to palpation, no masses, no rebound or guarding  GU: declined- does with gyn  SKIN: fine erythematous papulovesicular rash on arms and legs, more in creases  MS: normal gait, moves all extremities normally  NEURO: CN II-XII grossly intact, normal muscle strength and sensation to light touch on extremities  PSYCH:  normal affect, pleasant and cooperative  ASSESSMENT AND PLAN:  Discussed the following assessment and plan:  Visit for preventive health examination - Plan: Lipid Panel, Hemoglobin A1c  Allergic rhinitis, unspecified allergic rhinitis type  Depression  Allergic dermatitis   -Discussed and advised all [place] preventive services health task force level A and B recommendations for age, sex and risks.  -Advised at least 150 minutes of exercise per week and a healthy diet low in saturated fats and sweets and consisting of fresh fruits and vegetables, lean meats such as fish and white chicken and whole grains.  -labs, studies and vaccines per orders this encounter  Orders Placed This Encounter  Procedures  . Lipid Panel    Standing Status: Future     Number of Occurrences:      Standing Expiration Date: 12/21/2014  . Hemoglobin A1c    Standing Status: Future     Number of Occurrences:      Standing Expiration Date: 12/21/2014    Patient advised to return to clinic immediately if symptoms worsen or persist or new concerns.  Patient Instructions  BEFORE YOU LEAVE: -schedule lab appointment for fasting labs -follow up yearly for annual exam  -try the stronger steroid cream and let us know if this does not work  We recommend the following healthy lifestyle measures: - eat a healthy diet consisting of lots of vegetables, fruits, beans, nuts, seeds, healthy meats such as white chicken and fish and whole grains.  - avoid fried foods, fast food, processed foods, sodas, red meet and other fattening foods.  - get a least 150 minutes of aerobic exercise per week.      Return in about 1 year (around 10/21/2015).  Colin Benton R.

## 2014-10-31 ENCOUNTER — Other Ambulatory Visit (INDEPENDENT_AMBULATORY_CARE_PROVIDER_SITE_OTHER): Payer: 59

## 2014-10-31 DIAGNOSIS — Z0000 Encounter for general adult medical examination without abnormal findings: Secondary | ICD-10-CM

## 2014-10-31 LAB — LIPID PANEL
Cholesterol: 208 mg/dL — ABNORMAL HIGH (ref 0–200)
HDL: 63 mg/dL (ref >39.00)
LDL Cholesterol: 130 mg/dL — ABNORMAL HIGH (ref 0–99)
NONHDL: 144.55
Total CHOL/HDL Ratio: 3
Triglycerides: 73 mg/dL (ref 0.0–149.0)
VLDL: 14.6 mg/dL (ref 0.0–40.0)

## 2014-10-31 LAB — HEMOGLOBIN A1C: HEMOGLOBIN A1C: 5.5 % (ref 4.6–6.5)

## 2014-11-26 ENCOUNTER — Other Ambulatory Visit: Payer: Self-pay

## 2014-11-26 DIAGNOSIS — Z1231 Encounter for screening mammogram for malignant neoplasm of breast: Secondary | ICD-10-CM

## 2014-12-17 ENCOUNTER — Ambulatory Visit: Payer: 59

## 2014-12-24 ENCOUNTER — Ambulatory Visit
Admission: RE | Admit: 2014-12-24 | Discharge: 2014-12-24 | Disposition: A | Discharge status: Home / Self Care | Payer: 59 | Source: Ambulatory Visit

## 2014-12-24 DIAGNOSIS — Z1231 Encounter for screening mammogram for malignant neoplasm of breast: Secondary | ICD-10-CM

## 2014-12-25 ENCOUNTER — Encounter: Payer: Self-pay | Admitting: Family Medicine

## 2014-12-26 ENCOUNTER — Other Ambulatory Visit: Payer: Self-pay | Admitting: *Deleted

## 2014-12-26 DIAGNOSIS — R5383 Other fatigue: Secondary | ICD-10-CM

## 2014-12-27 ENCOUNTER — Other Ambulatory Visit: Payer: Self-pay | Admitting: Obstetrics & Gynecology

## 2014-12-27 DIAGNOSIS — R928 Other abnormal and inconclusive findings on diagnostic imaging of breast: Secondary | ICD-10-CM

## 2015-01-02 ENCOUNTER — Other Ambulatory Visit: Payer: 59

## 2015-01-08 ENCOUNTER — Ambulatory Visit
Admission: RE | Admit: 2015-01-08 | Discharge: 2015-01-08 | Disposition: A | Discharge status: Home / Self Care | Payer: 59 | Source: Ambulatory Visit | Attending: Obstetrics & Gynecology | Admitting: Obstetrics & Gynecology

## 2015-01-08 ENCOUNTER — Other Ambulatory Visit (INDEPENDENT_AMBULATORY_CARE_PROVIDER_SITE_OTHER): Payer: 59

## 2015-01-08 DIAGNOSIS — R5383 Other fatigue: Secondary | ICD-10-CM | POA: Diagnosis not present

## 2015-01-08 DIAGNOSIS — R928 Other abnormal and inconclusive findings on diagnostic imaging of breast: Secondary | ICD-10-CM

## 2015-01-08 LAB — TSH: TSH: 2.2 u[IU]/mL (ref 0.35–4.50)

## 2015-05-02 MED FILL — TRETINOIN 0.025% CREAM: 0.025 | 30 days supply | Qty: 20 | Fill #2

## 2015-05-02 MED FILL — BUPROPION HCL XL 150 MG TAB: 150 | 90 days supply | Qty: 90 | Fill #2

## 2015-07-20 IMAGING — MG MM DIGITAL SCREENING 3D TOMO
3 series · 3 of 15 positions shown · non-contrast
Comparison: None.

CLINICAL DATA: Screening.

DIGITAL BREAST TOMOSYNTHESIS
 DIGITAL BREAST TOMOSYNTHESIS
Digital breast tomosynthesis images are acquired in two
projections.  These images are reviewed in combination with the
digital mammogram, confirming the findings below.

[R CC tomo · tomo slice 31/60.0]
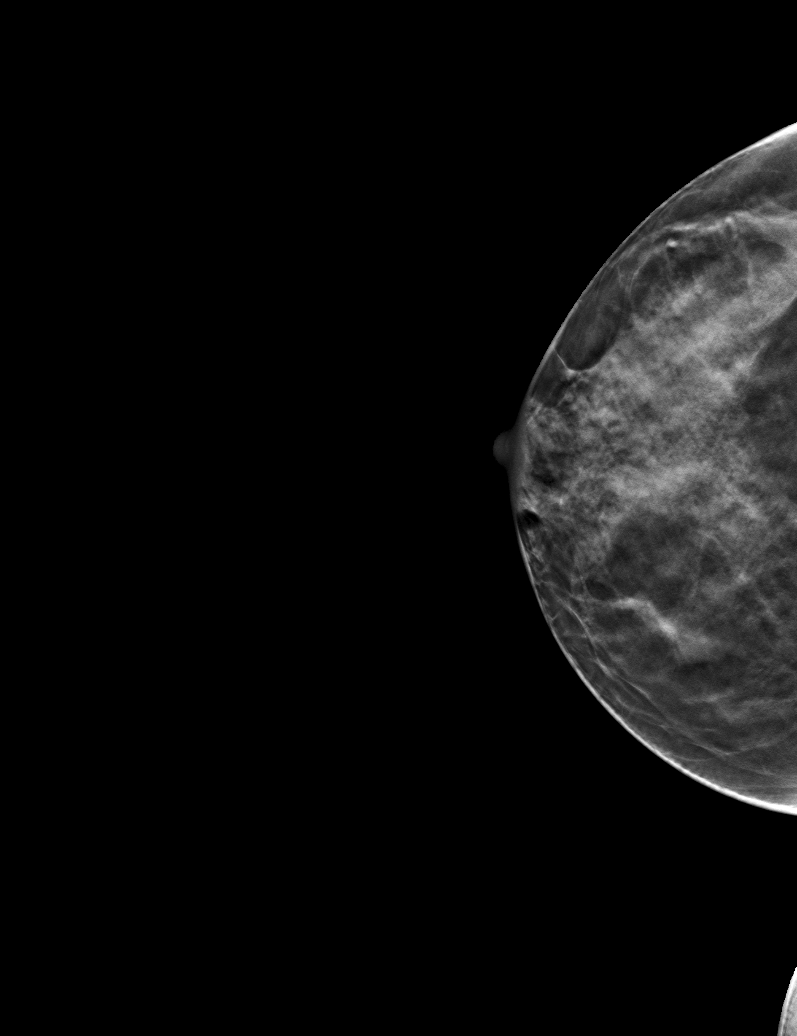

[R MLO tomo · tomo slice 26/51.0]
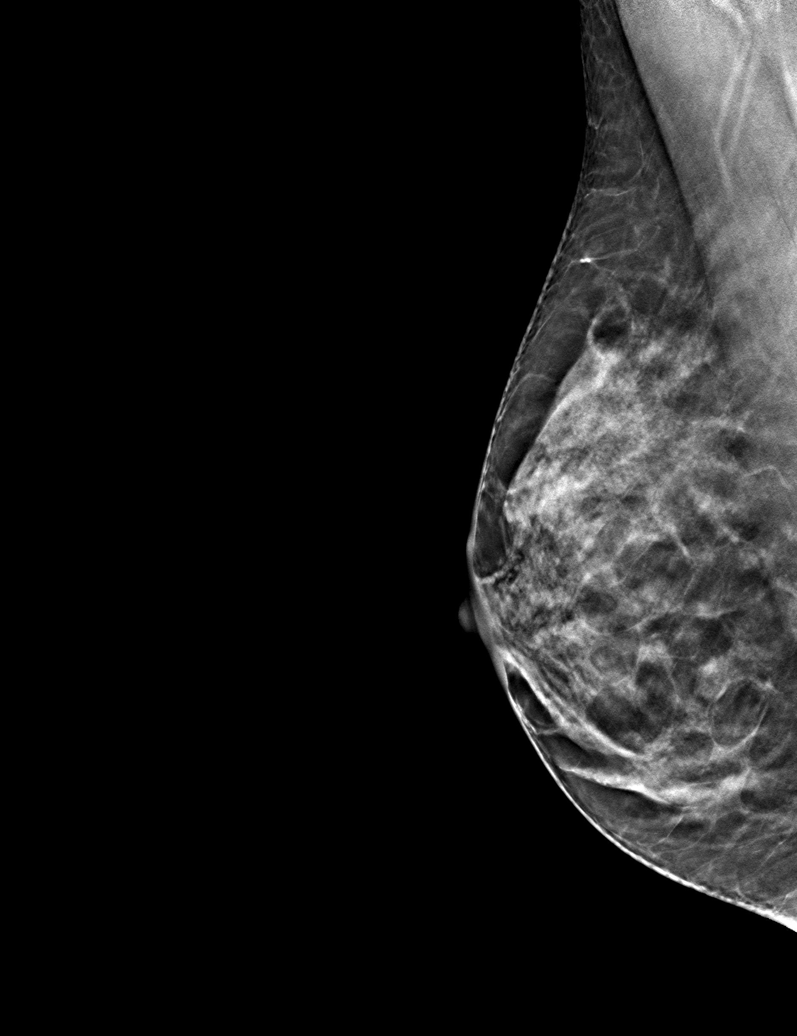

[L MLO tomo · tomo slice 26/51.0]
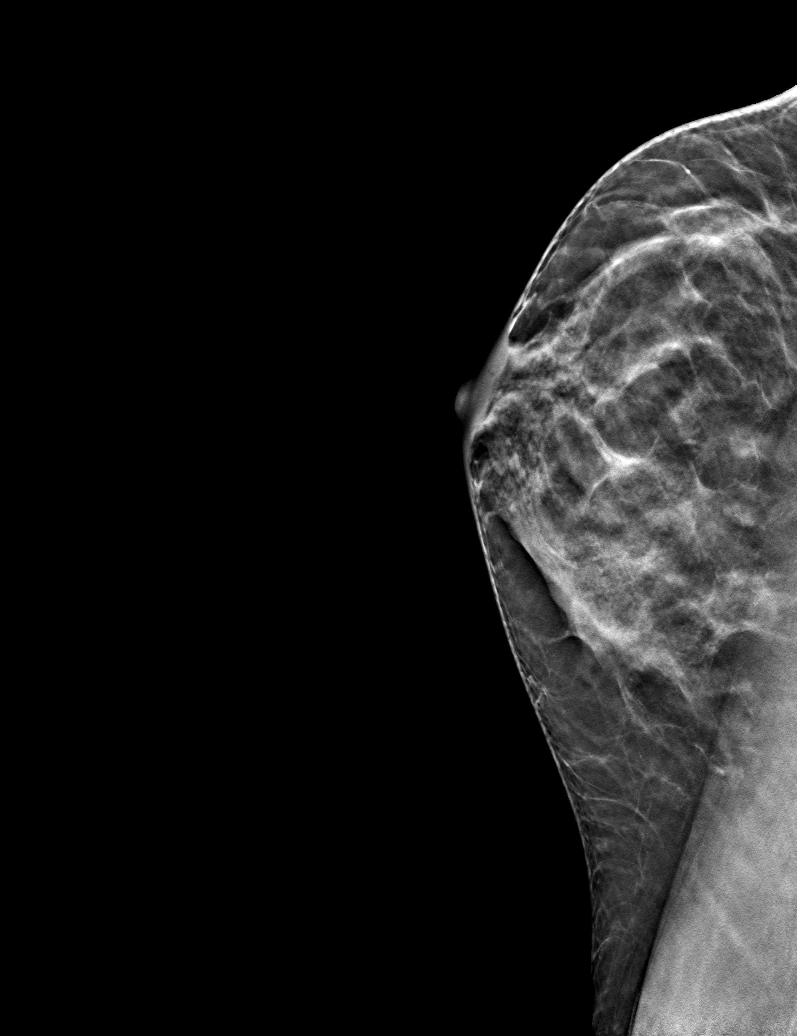

[3 of 15 positions shown; findings below may reference images not displayed]

FINDINGS: ACR Breast Density Category c:  The breast tissue is
heterogeneously dense, which may obscure small masses.

There are no findings suspicious for malignancy.
IMPRESSION: No mammographic evidence of malignancy.

A result letter of this screening mammogram will be mailed directly
to the patient.

RECOMMENATION:
Screening mammogram in one year. (Code:07-A-3G8)

BI-RADS CATEGORY 1:  Negative.

## 2015-08-04 MED FILL — RESTASIS 0.05% EYE EMULSION: 0.05 | 90 days supply | Qty: 180 | Fill #1

## 2015-08-04 MED FILL — BUPROPION HCL XL 150 MG TAB: 150 | 90 days supply | Qty: 90 | Fill #3

## 2015-10-30 ENCOUNTER — Other Ambulatory Visit: Payer: Self-pay | Admitting: Family Medicine

## 2015-10-30 MED FILL — BUPROPION HCL XL 150 MG TAB: 150 | 90 days supply | Qty: 90 | Fill #0

## 2015-10-30 NOTE — Telephone Encounter (Signed)
Pt request refill   buPROPion (WELLBUTRIN XL) 150 MG 24 hr tablet  Pt made a cpe for 10/31 but is out of refills.  Ok to refill until she comes in, or work in sooner?  [facility] out pt [address] [address]

## 2015-10-30 NOTE — Telephone Encounter (Signed)
Refilled to appt. 

## 2015-11-03 ENCOUNTER — Encounter: Payer: Self-pay | Admitting: *Deleted

## 2015-11-04 DIAGNOSIS — S0501XA Injury of conjunctiva and corneal abrasion without foreign body, right eye, initial encounter: Secondary | ICD-10-CM | POA: Diagnosis not present

## 2015-11-04 MED FILL — TOBRAMYCIN-DEXAMETH OPTH SU: 0.3-0.1 | 7 days supply | Qty: 5 | Fill #0

## 2015-11-05 DIAGNOSIS — D225 Melanocytic nevi of trunk: Secondary | ICD-10-CM | POA: Diagnosis not present

## 2015-11-05 DIAGNOSIS — L814 Other melanin hyperpigmentation: Secondary | ICD-10-CM | POA: Diagnosis not present

## 2015-11-05 DIAGNOSIS — L821 Other seborrheic keratosis: Secondary | ICD-10-CM | POA: Diagnosis not present

## 2015-11-05 DIAGNOSIS — Z872 Personal history of diseases of the skin and subcutaneous tissue: Secondary | ICD-10-CM | POA: Diagnosis not present

## 2015-11-05 DIAGNOSIS — D1801 Hemangioma of skin and subcutaneous tissue: Secondary | ICD-10-CM | POA: Diagnosis not present

## 2015-11-05 DIAGNOSIS — D485 Neoplasm of uncertain behavior of skin: Secondary | ICD-10-CM | POA: Diagnosis not present

## 2015-11-10 ENCOUNTER — Ambulatory Visit (INDEPENDENT_AMBULATORY_CARE_PROVIDER_SITE_OTHER): Payer: 59 | Admitting: Sports Medicine

## 2015-11-10 ENCOUNTER — Other Ambulatory Visit: Payer: Self-pay

## 2015-11-10 ENCOUNTER — Encounter: Payer: Self-pay | Admitting: Sports Medicine

## 2015-11-10 VITALS — BP 112/70 | Ht 64.0 in | Wt 125.0 lb

## 2015-11-10 DIAGNOSIS — M7662 Achilles tendinitis, left leg: Secondary | ICD-10-CM | POA: Diagnosis not present

## 2015-11-10 DIAGNOSIS — M79672 Pain in left foot: Secondary | ICD-10-CM

## 2015-11-10 NOTE — Assessment & Plan Note (Signed)
Likely tendinitis given fluid seen in the retrocalcaneal space on ultrasound exam. No signs of tears or chronic tendinopathy. - Patient given bilateral heel lifts to place in running shoes to help take pressure off her Achilles - Advised that she may return to running as tolerated - Patient given Achilles strengthening exercises - She will follow up in 4 weeks if not improved

## 2015-11-10 NOTE — Progress Notes (Signed)
   [facility] Phone: [phone]  Subjective:  Bethany Oliver is a 45 year old female presenting with left achilles tendon pain at the last 1.5 months. She initially thought the pain was due to her running shoes being too old, so he bought new shoes, but she continued to have pain. She stopped running about 1 month ago and continued walking and doing HITT classes. She noted worsening pain after the HITT classes so then she began only swimming and biking. She states the pain is located where the Achilles inserts onto the heel. She describes the pain as an "annoying" pain. She can usually get through whatever exercise she is performing, but she is worried that the pain will continue to worsen. The pain is worse in the morning and worse if she is walking a lot throughout the day. The pain is better with wearing Danskos instead of flat shoes. She has been icing her achilles every night and using ibuprofen as needed. She doesn't feel like these interventions are helping much. She runs 10-12 miles per week.  ROS: See HPI for pertinent positives and negatives  Past Medical History- non-contributory  Family history reviewed for today's visit. No changes.  Social history- patient is a never smoker.  Objective: BP 112/70   Ht 5\' 4"  (1.626 m)   Wt 125 lb (56.7 kg)   BMI 21.46 kg/m  Gen: NAD, alert, cooperative with exam Left Foot: No swelling or erythema of achilles or calcaneus. Mild tenderness to palpation of the region where the achilles inserts on the calcaneus. Full range of motion of the ankle. Pain elicited with single leg calf raise. Distal leg neurovascularly intact. Neuro: 5/5 strength in lower extremities.  Procedures: Limited ultrasound of the left Achilles was performed. Both long and short images were obtained. Achilles tendon appears unremarkable. No thickening. No obvious tears. There is a small amount of fluid in the retrocalcaneal space. No spurring at the calcaneus  is seen. Findings are consistent with a normal Achilles and possible mild retrocalcaneal bursitis  Assessment/Plan: Left Achilles Tendonitis: Likely tendinitis given fluid seen in the retrocalcaneal space on ultrasound exam. No signs of tears or chronic tendinopathy. - Patient given bilateral heel lifts to place in running shoes to help take pressure off her Achilles - Advised that she may return to running as tolerated - Patient given Achilles strengthening exercises - She will follow up in 4 weeks if not improved    Hyman Bible, MD PGY-2  Patient seen and evaluated with the resident. I agree with the above plan of care.

## 2015-11-11 MED FILL — TRETINOIN 0.025% CREAM: 0.025 | 30 days supply | Qty: 20 | Fill #0

## 2015-12-08 ENCOUNTER — Encounter: Payer: Self-pay | Admitting: Sports Medicine

## 2015-12-08 ENCOUNTER — Ambulatory Visit (INDEPENDENT_AMBULATORY_CARE_PROVIDER_SITE_OTHER): Payer: 59 | Admitting: Sports Medicine

## 2015-12-08 ENCOUNTER — Other Ambulatory Visit: Payer: Self-pay | Admitting: Obstetrics & Gynecology

## 2015-12-08 VITALS — BP 107/47 | Ht 64.0 in | Wt 125.0 lb

## 2015-12-08 DIAGNOSIS — M7662 Achilles tendinitis, left leg: Secondary | ICD-10-CM | POA: Diagnosis not present

## 2015-12-08 DIAGNOSIS — Z1231 Encounter for screening mammogram for malignant neoplasm of breast: Secondary | ICD-10-CM

## 2015-12-08 NOTE — Progress Notes (Signed)
   Subjective:  Patient ID: Bethany Oliver, female    DOB: [birth year]  Age: 45 y.o. MRN: [id number]  CC: Left Heel Pain Follow-Up  HPI Bethany Oliver is a 45 year old female who presents today for left heel pain follow up. Patient reports that she is feeling much better today and feels the injury is getting better. After her appointment 4 weeks ago, she tried to get back to running the first week, but it was too painful. She attempted again the second week. She managed to go on a run, but she had a lot of pain. She attempted to run this weekend and was able to run without pain for half of her run. The other half occurred with a "nagging" pain that she could tolerate.   Patient reports that pain is localized to the back of the heel. She reports that the pain is dull and nagging . She reports that it currently is mostly provoked by running. She has been icing it as instructed from last visit on 9/18 and she says that has provided her relief. She reports only taking ibuprofen as need. Patient says that heel feels stiff in the morning, but has been feeling less stiff recently. Patient denies any swelling, numbness, or tingling.   ROS Review of Systems  Musculoskeletal:       Left Ankle Pain  Refer to HPI for any pertinent positives and negatives  Objective:  BP (!) 107/47   Ht 5\' 4"  (1.626 m)   Wt 125 lb (56.7 kg)   BMI 21.46 kg/m   Physical Exam  Musculoskeletal:  Left Foot: Redness over the achilles tendon. No tenderness or swelling present. Normal ROM. Normal strength. Neurovascularly intact.   Ultrasound Limited ultrasound of the left Achilles tendon was performed. No thickening or tearing was present. Small amount of fluid present in the retrocalcaneal area. Noticeable decrease in the amount of fluid compared to limited left Achilles tendon U/S on 9/18.    Assessment & Plan:   1. Left Heel Pain Secondary to Achilles Tendonitis/ Retrocalcaneal bursitis Based on history, physical  exam, and ultrasound, we believe that her injury is beginning to resolve. Instructed patient to continue icing it and continue the Achilles strengthening exercises she was given last time. Patient can continue running, but advised patient to ease into running. Instructed patient that she does not need to follow-up as we think this will resolve in 4-8 weeks, but told patient to follow-up if patient does not feel it is getting better. Patient agreed.     Follow-up: No Follow-up on file.   Salvadore Dom, Medical Student  Patient seen and evaluated with the medical student. I agree with the above plan of care. Patient's presentation is primarily that of retrocalcaneal bursitis and not true Achilles tendinitis. She is improving. She will continue to increase activity as above. Continue to utilize her heel lifts continue with home exercises as well. I explained to her that it may take another 4-8 weeks before complete symptom resolution. Follow-up for ongoing or recalcitrant issues.

## 2015-12-12 DIAGNOSIS — Z6822 Body mass index (BMI) 22.0-22.9, adult: Secondary | ICD-10-CM | POA: Diagnosis not present

## 2015-12-12 DIAGNOSIS — Z01419 Encounter for gynecological examination (general) (routine) without abnormal findings: Secondary | ICD-10-CM | POA: Diagnosis not present

## 2015-12-22 NOTE — Progress Notes (Signed)
HPI:  Here for CPE: HPI, Physical, PMH and assessment have been reviewed and updated as needed.   -Concerns and/or follow up today:  Cold sores: -uses OTC top tx and this shuts them done before lesion occurs -2 times this year when she felt they may be coming on -wanted to discuss tx options  MDD: -mild, started in 2010 with lots of changes in life -on Wellbutrin, lower does since 2010 -wants refill on med, does not feel now is a good time to try off medication -no depression or si  -Diet: variety of foods, balance and well rounded  -Exercise: regular exercise  -Taking folic acid, vitamin D or calcium: no  -Diabetes and Dyslipidemia Screening: FASITNG for labs  -Hx of HTN: no  -Vaccines: UTD  -pap history: sees gyn, utd  -FDLMP:110/12/17  -sexual activity: yes, female partner, no new partners  -wants STI testing (Hep C if born [birth year]): no  -FH breast, colon or ovarian ca: see FH Last mammogram: does breast health with gyn, Dr. Dellis Filbert Last colon cancer screening: n/a   -Alcohol, Tobacco, drug use: see social history  Review of Systems - no fevers, unintentional weight loss, vision loss, hearing loss, chest pain, sob, hemoptysis, melena, hematochezia, hematuria, genital discharge, changing or concerning skin lesions, bleeding, bruising, loc, thoughts of self harm or SI  Past Medical History:  Diagnosis Date  . Allergy   . Depression    mild, no hospitalization  . Dry eyes     Past Surgical History:  Procedure Laterality Date  . invetro    . IVF  2008-20012  . LASIK  05/2011  . LEEP  1999    Family History  Problem Relation Age of Onset  . Hypertension Mother   . Parkinson's disease Father   . Hyperlipidemia Father   . Mental illness Father   . Heart disease Maternal Grandmother   . Diabetes Maternal Grandfather   . Heart disease Maternal Grandfather     Social History   Social History  . Marital status: Married    Spouse name: N/A  . Number  of children: N/A  . Years of education: N/A   Social History Main Topics  . Smoking status: Never Smoker  . Smokeless tobacco: Never Used  . Alcohol use Yes     Comment: occ  . Drug use: Unknown  . Sexual activity: Not Asked   Other Topics Concern  . 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 and adopted child 2yo in 2015      Spiritual Beliefs: catholic      Lifestyle: does get regular CV exercise; diet is good              Current Outpatient Prescriptions:  .  buPROPion (WELLBUTRIN XL) 150 MG 24 hr tablet, TAKE 1 TABLET BY MOUTH DAILY., Disp: 90 tablet, Rfl: 3 .  fexofenadine (ALLEGRA) 180 MG tablet, Take 180 mg by mouth daily., Disp: , Rfl:  .  RESTASIS 0.05 % ophthalmic emulsion, , Disp: , Rfl: 4 .  Tretinoin (RETIN-A EX), Apply topically at bedtime., Disp: , Rfl:   EXAM:  Vitals:   12/23/15 0913  BP: 98/68  Pulse: 69  Temp: 97.7 F (36.5 C)    GENERAL: vitals reviewed and listed below, alert, oriented, appears well hydrated and in no acute distress  HEENT: head atraumatic, PERRLA, normal appearance  of eyes, ears, nose and mouth. moist mucus membranes.  NECK: supple, no masses or lymphadenopathy  LUNGS: clear to auscultation bilaterally, no rales, rhonchi or wheeze  CV: HRRR, no peripheral edema or cyanosis, normal pedal pulses  BREAST: declined, does with gyn  ABDOMEN: bowel sounds normal, soft, non tender to palpation, no masses, no rebound or guarding  GU: declined, does with gyn  RECTAL: refused  SKIN: no rash or abnormal lesions  MS: normal gait, moves all extremities normally  NEURO: normal gait, speech and thought processing grossly intact, muscle tone grossly intact throughout  PSYCH: normal affect, pleasant and cooperative  ASSESSMENT AND PLAN:  Discussed the following assessment and plan:  Encounter for preventive health  examination - Plan: Lipid panel, Hemoglobin A1c  Dyslipidemia  Major depressive disorder with single episode, in full remission (Wells)  H/O cold sores  -Discussed and advised all [place] preventive services health task force level A and B recommendations for age, sex and risks.  -Advised at least 150 minutes of exercise per week and a healthy diet with avoidance of (less then 1 serving per week) processed foods, white starches, red meat, fast foods and sweets and consisting of: * 5-9 servings of fresh fruits and vegetables (not corn or potatoes) *nuts and seeds, beans *olives and olive oil *lean meats such as fish and white chicken  *whole grains  -discussed treatment options for cold sores  -refilled welbutrin  -labs, studies and vaccines per orders this encounter  Orders Placed This Encounter  Procedures  . Lipid panel  . Hemoglobin A1c    Patient advised to return to clinic immediately if symptoms worsen or persist or new concerns.  Patient Instructions  BEFORE YOU LEAVE: -labs -follow up: yearly and as needed  We have ordered labs or studies at this visit. It can take up to 1-2 weeks for results and processing. IF results require follow up or explanation, we will call you with instructions. Clinically stable results will be released to your [facility]. If you have not heard from [place] or cannot find your results in [facility] in 2 weeks please contact our office at [phone].  If you are not yet signed up for [facility], please consider signing up.   We recommend the following healthy lifestyle for LIFE: 1) Small portions.   Tip: eat off of a salad plate instead of a dinner plate.  Tip: It is ok to feel hungry after a meal - that likely means you ate an appropriate portion.  Tip: if you need more or a snack choose fruits, veggies and/or a handful of nuts or seeds.  2) Eat a healthy clean diet.  * Tip: Avoid (less then 1 serving per week): processed foods, sweets, sweetened drinks,  white starches (rice, flour, bread, potatoes, pasta, etc), red meat, fast foods, butter  *Tip: CHOOSE instead   * 5-9 servings per day of fresh or frozen fruits and vegetables (but not corn, potatoes, bananas, canned or dried fruit)   *nuts and seeds, beans   *olives and olive oil   *small portions of lean meats such as fish and white chicken    *small portions of whole grains  3)Get at least 150 minutes of sweaty aerobic exercise per week.  4)Reduce stress - consider counseling, meditation and relaxation to balance other aspects of your life.            No Follow-up on file.  Colin Benton R., DO

## 2015-12-23 ENCOUNTER — Ambulatory Visit (INDEPENDENT_AMBULATORY_CARE_PROVIDER_SITE_OTHER): Payer: 59 | Admitting: Family Medicine

## 2015-12-23 ENCOUNTER — Encounter: Payer: Self-pay | Admitting: Family Medicine

## 2015-12-23 VITALS — BP 98/68 | HR 69 | Temp 97.7°F | Ht 64.25 in | Wt 125.2 lb

## 2015-12-23 DIAGNOSIS — E785 Hyperlipidemia, unspecified: Secondary | ICD-10-CM | POA: Diagnosis not present

## 2015-12-23 DIAGNOSIS — F325 Major depressive disorder, single episode, in full remission: Secondary | ICD-10-CM | POA: Diagnosis not present

## 2015-12-23 DIAGNOSIS — Z0000 Encounter for general adult medical examination without abnormal findings: Secondary | ICD-10-CM | POA: Diagnosis not present

## 2015-12-23 DIAGNOSIS — Z8619 Personal history of other infectious and parasitic diseases: Secondary | ICD-10-CM

## 2015-12-23 LAB — HEMOGLOBIN A1C: Hgb A1c MFr Bld: 5.3 % (ref 4.6–6.5)

## 2015-12-23 LAB — LIPID PANEL
CHOL/HDL RATIO: 3
Cholesterol: 209 mg/dL — ABNORMAL HIGH (ref 0–200)
HDL: 73.4 mg/dL (ref >39.00)
LDL CALC: 122 mg/dL — AB (ref 0–99)
NonHDL: 135.77
TRIGLYCERIDES: 68 mg/dL (ref 0.0–149.0)
VLDL: 13.6 mg/dL (ref 0.0–40.0)

## 2015-12-23 NOTE — Progress Notes (Signed)
Pre visit review using our clinic review tool, if applicable. No additional management support is needed unless otherwise documented below in the visit note. 

## 2015-12-23 NOTE — Patient Instructions (Signed)
BEFORE YOU LEAVE: -labs -follow up: yearly and as needed  We have ordered labs or studies at this visit. It can take up to 1-2 weeks for results and processing. IF results require follow up or explanation, we will call you with instructions. Clinically stable results will be released to your [facility]. If you have not heard from [place] or cannot find your results in [facility] in 2 weeks please contact our office at [phone].  If you are not yet signed up for [facility], please consider signing up.   We recommend the following healthy lifestyle for LIFE: 1) Small portions.   Tip: eat off of a salad plate instead of a dinner plate.  Tip: It is ok to feel hungry after a meal - that likely means you ate an appropriate portion.  Tip: if you need more or a snack choose fruits, veggies and/or a handful of nuts or seeds.  2) Eat a healthy clean diet.  * Tip: Avoid (less then 1 serving per week): processed foods, sweets, sweetened drinks, white starches (rice, flour, bread, potatoes, pasta, etc), red meat, fast foods, butter  *Tip: CHOOSE instead   * 5-9 servings per day of fresh or frozen fruits and vegetables (but not corn, potatoes, bananas, canned or dried fruit)   *nuts and seeds, beans   *olives and olive oil   *small portions of lean meats such as fish and white chicken    *small portions of whole grains  3)Get at least 150 minutes of sweaty aerobic exercise per week.  4)Reduce stress - consider counseling, meditation and relaxation to balance other aspects of your life.

## 2015-12-26 ENCOUNTER — Ambulatory Visit: Payer: 59

## 2016-01-01 ENCOUNTER — Ambulatory Visit: Payer: 59

## 2016-01-06 ENCOUNTER — Encounter: Payer: Self-pay | Admitting: Family Medicine

## 2016-01-07 MED FILL — levoFLOXacin 500 MG TABS: 500 | 10 days supply | Qty: 10 | Fill #0

## 2016-02-03 ENCOUNTER — Ambulatory Visit
Admission: RE | Admit: 2016-02-03 | Discharge: 2016-02-03 | Disposition: A | Discharge status: Home / Self Care | Payer: 59 | Source: Ambulatory Visit | Attending: Obstetrics & Gynecology | Admitting: Obstetrics & Gynecology

## 2016-02-03 DIAGNOSIS — Z1231 Encounter for screening mammogram for malignant neoplasm of breast: Secondary | ICD-10-CM | POA: Diagnosis not present

## 2016-02-03 MED FILL — BUPROPION HCL XL 150 MG TAB: 150 | 90 days supply | Qty: 90 | Fill #1

## 2016-04-05 ENCOUNTER — Ambulatory Visit: Payer: 59 | Admitting: Family Medicine

## 2016-04-06 ENCOUNTER — Encounter: Payer: Self-pay | Admitting: Family Medicine

## 2016-04-06 ENCOUNTER — Ambulatory Visit (INDEPENDENT_AMBULATORY_CARE_PROVIDER_SITE_OTHER): Payer: 59 | Admitting: Family Medicine

## 2016-04-06 VITALS — BP 122/78 | HR 66 | Temp 97.9°F | Resp 12 | Ht 64.25 in | Wt 126.4 lb

## 2016-04-06 DIAGNOSIS — J029 Acute pharyngitis, unspecified: Secondary | ICD-10-CM | POA: Diagnosis not present

## 2016-04-06 DIAGNOSIS — J069 Acute upper respiratory infection, unspecified: Secondary | ICD-10-CM | POA: Diagnosis not present

## 2016-04-06 LAB — POCT RAPID STREP A (OFFICE): RAPID STREP A SCREEN: NEGATIVE

## 2016-04-06 MED ORDER — MOUTH WASH-GP PO LIQD: 2.0000 cm2 | Freq: Three times a day (TID) | ORAL | 0 refills | Status: DC | PRN

## 2016-04-06 NOTE — Patient Instructions (Addendum)
  [name] I have seen you today for an acute visit.  A few things to remember from today's visit:   URI, acute  Acute pharyngitis, unspecified etiology - Plan: POC Rapid Strep A, Culture, Group A Strep, Mouthwash Compounding Base (MOUTH WASH-GP) LIQD   viral infections are self-limited and we treat each symptom depending of severity.  Over the counter medications as decongestants and cold medications usually help, they need to be taken with caution if there is a history of high blood pressure or palpitations. Tylenol and/or Ibuprofen also helps with most symptoms (headache, muscle aching, fever,etc) Plenty of fluids. Honey helps with cough. Steam inhalations helps with runny nose, nasal congestion, and may prevent sinus infections. Cough and nasal congestion could last a few days and sometimes weeks.   Symptomatic treatment: Over the counter Acetaminophen 500 mg and/or Ibuprofen (400-600 mg) if there is not contraindications; you can alternate in between both every 4-6 hours. Gargles with saline water and throat lozenges might also help. Cold fluids.    Seek prompt medical evaluation if you are having difficulty breathing, mouth swelling, throat closing up, not able to swallow liquids (drooling), skin rash/bruising, or worsening symptoms.  Please follow up in 2 weeks if not any better.      In general please monitor for signs of worsening symptoms and seek immediate medical attention if any concerning.

## 2016-04-06 NOTE — Progress Notes (Signed)
Pre visit review using our clinic review tool, if applicable. No additional management support is needed unless otherwise documented below in the visit note. 

## 2016-04-06 NOTE — Progress Notes (Signed)
HPI:  ACUTE VISIT  Chief Complaint  Patient presents with  . flu like symptoms    [name] is a 46 y.o.female here today complaining of 5-6 days of respiratory symptoms.  Symptoms started with nasal congestion and rhinorrhea then 2 days later  "bad" sore throat and non productive cough. Fever 99 F, last time 3 days ago. No chills or body aches.  Husband, who is a oncologists in the area,  saw a few "canker" ulcers in the back of her throat. Sore throat is aggravated by cough, "intense."  She has not noted chest pain, dyspnea, or wheezing.  No Hx of recent travel. Sick contact: Daughter.   OTC medications for this problem: Sudafed,Mucinex DM, Ibuprofen, and Tylenol.   Symptoms otherwise improving except for sore throat.   Review of Systems  Constitutional: Positive for appetite change, fatigue and fever. Negative for activity change and chills.  HENT: Positive for mouth sores, postnasal drip, rhinorrhea and sore throat. Negative for ear pain, sinus pressure, sneezing, trouble swallowing and voice change.   Eyes: Negative for discharge and redness.  Respiratory: Positive for cough. Negative for shortness of breath and wheezing.   Cardiovascular: Negative for leg swelling.  Gastrointestinal: Negative for abdominal pain, diarrhea, nausea and vomiting.  Musculoskeletal: Negative for myalgias and neck pain.  Skin: Negative for rash.  Neurological: Negative for syncope, weakness and headaches.  Hematological: Negative for adenopathy. Does not bruise/bleed easily.  Psychiatric/Behavioral: Negative for confusion.      Current Outpatient Prescriptions on File Prior to Visit  Medication Sig Dispense Refill  . buPROPion (WELLBUTRIN XL) 150 MG 24 hr tablet TAKE 1 TABLET BY MOUTH DAILY. 90 tablet 3  . fexofenadine (ALLEGRA) 180 MG tablet Take 180 mg by mouth daily.    . RESTASIS 0.05 % ophthalmic emulsion   4  . Tretinoin (RETIN-A EX) Apply topically at  bedtime.     No current facility-administered medications on file prior to visit.      Past Medical History:  Diagnosis Date  . Allergy   . Depression    mild, no hospitalization  . Dry eyes    No Known Allergies  Social History   Social History  . Marital status: Married    Spouse name: N/A  . Number of children: N/A  . Years of education: N/A   Social History Main Topics  . Smoking status: Never Smoker  . Smokeless tobacco: Never Used  . Alcohol use Yes     Comment: occ  . Drug use: Unknown  . Sexual activity: Not Asked   Other Topics Concern  . 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 Body mass index is 21.53 kg/m.   Physical Exam  Nursing note and vitals reviewed. Constitutional: She is oriented to person, place, and time. She appears well-developed and well-nourished. She does not appear ill. No distress.  HENT:  Head: Atraumatic.  Right Ear: Tympanic membrane, external ear and ear canal  normal.  Left Ear: Tympanic membrane, external ear and ear canal normal.  Nose: Rhinorrhea present. Right sinus exhibits no maxillary sinus tenderness and no frontal sinus tenderness. Left sinus exhibits no maxillary sinus tenderness and no frontal sinus tenderness.  Mouth/Throat: Uvula is midline and mucous membranes are normal. Posterior oropharyngeal erythema (Mild) present. No oropharyngeal exudate or posterior oropharyngeal edema.    On right anterior pillar of soft palate with 2-3 barely noticeable micropapular lesions, no vesicles, and no edema.  Eyes: Conjunctivae and EOM are normal.  Neck: No muscular  tenderness present. No edema and no erythema present.  Cardiovascular: Normal rate and regular rhythm.   No murmur heard. Respiratory: Effort normal and breath sounds normal. No stridor. No respiratory distress.  Lymphadenopathy:       Head (right side): No submandibular adenopathy present.       Head (left side): No submandibular adenopathy present.    She has cervical adenopathy (small, not tender.).       Right cervical: Posterior cervical adenopathy present.       Left cervical: Posterior cervical adenopathy present.  Neurological: She is alert and oriented to person, place, and time. She has normal strength. Gait normal.  Skin: Skin is warm. No rash noted. No erythema.  Psychiatric: Her speech is normal. Her mood appears anxious.  Well groomed, good eye contact.      ASSESSMENT AND PLAN:     Tita was seen today for flu like symptoms.  Diagnoses and all orders for this visit:  URI, acute  Acute pharyngitis, unspecified etiology -     POC Rapid Strep A -     Culture, Group A Strep -     Mouthwash Compounding Base (MOUTH WASH-GP) LIQD; Take 2 cm2 by mouth 3 (three) times daily as needed.   Symptoms suggests a viral etiology, I explained patient that symptomatic treatment is usually recommended in this case, so I do not think abx is needed at this time. We discussed treatment options for pain, Lidocaine recommended.  She would like "mouth magic wash" with Myralax and Benadryl.   I gave her a Rx for Myralax+Benadryl+Lidocaine that could be prepare in a local pharmacy.If she cannot have this Rx fill I instructed her to let me know, so I can send Rx for Lidocaine and she can mix with other medications at home.  Instructed to monitor for signs of complications, including new onset of fever among some, instructed about warning signs. I also explained that cough and nasal congestion can last a few days and sometimes weeks. F/U as needed.    -Ms. Bethany Oliver Bethany Oliver was  advised to return or notify a doctor immediately if symptoms worsen or persist or new concerns arise.       Eveleen Mcnear G. [place], MD  [facility]. [place] office.

## 2016-05-06 MED FILL — BUPROPION HCL XL 150 MG TAB: 150 | 90 days supply | Qty: 90 | Fill #2

## 2016-06-01 DIAGNOSIS — H04123 Dry eye syndrome of bilateral lacrimal glands: Secondary | ICD-10-CM | POA: Diagnosis not present

## 2016-06-01 MED FILL — TRETINOIN 0.025% CREAM: 0.025 | 30 days supply | Qty: 20 | Fill #1

## 2016-06-01 MED FILL — RESTASIS 0.05% EYE EMULSION: 0.05 | 30 days supply | Qty: 60 | Fill #0

## 2016-08-03 MED FILL — BUPROPION HCL XL 150 MG TAB: 150 | 90 days supply | Qty: 90 | Fill #3

## 2016-10-27 MED FILL — RESTASIS 0.05% EYE EMULSION: 0.05 | 30 days supply | Qty: 60 | Fill #1

## 2016-10-28 ENCOUNTER — Other Ambulatory Visit: Payer: Self-pay | Admitting: *Deleted

## 2016-10-28 MED ORDER — BUPROPION HCL ER (XL) 150 MG PO TB24: 150.0000 mg | ORAL_TABLET | Freq: Every day | ORAL | 1 refills | Status: DC

## 2016-10-28 MED FILL — buPROPion HCL ER (XL) 150 M: 150 | 90 days supply | Qty: 90 | Fill #0

## 2016-10-28 NOTE — Telephone Encounter (Signed)
Rx done. 

## 2016-10-31 NOTE — Progress Notes (Signed)
HPI:  Bethany Oliver is a pleasant 46 y.o. here for follow up. Chronic medical problems summarized below were reviewed for changes. Doing well. Did go thru some stress with daughter starting school. Wants to continue the Effexor. Fasting for recheck on labs. Denies CP, SOB, DOE, treatment intolerance or new symptoms. Declined flu shot as plans to get at work. Sees gyn for women's health exams, sees dermatologist for skin exams.  Cold sores: -using OTC prn treatment  MDD: -mild, started in 2010 with lots of changes in life -on Wellbutrin, lower does since 2010  Seasonal allergies: -taking zyrtec now and doing great  Mild HLD: -treating with lifestyle  ROS: See pertinent positives and negatives per HPI.  Past Medical History:  Diagnosis Date  . Allergy   . Depression    mild, no hospitalization  . Dry eyes     Past Surgical History:  Procedure Laterality Date  . invetro    . IVF  2008-20012  . LASIK  05/2011  . LEEP  1999    Family History  Problem Relation Age of Onset  . Hypertension Mother   . Parkinson's disease Father   . Hyperlipidemia Father   . Mental illness Father   . Heart disease Maternal Grandmother   . Diabetes Maternal Grandfather   . Heart disease Maternal Grandfather     Social History   Social History  . Marital status: Married    Spouse name: N/A  . Number of children: N/A  . Years of education: N/A   Social History Main Topics  . Smoking status: Never Smoker  . Smokeless tobacco: Never Used  . Alcohol use Yes     Comment: occ  . Drug use: Unknown  . Sexual activity: Not Asked   Other Topics Concern  . 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  buPROPion (WELLBUTRIN XL) 150 MG 24 hr tablet, Take 1 tablet (150 mg total) by mouth daily., Disp: 90 tablet, Rfl: 3 .  fexofenadine (ALLEGRA) 180 MG tablet, Take 180 mg by mouth daily., Disp: , Rfl:  .  RESTASIS 0.05 % ophthalmic emulsion, , Disp: , Rfl: 4 .  Tretinoin (RETIN-A EX), Apply topically at bedtime., Disp: , Rfl:   EXAM:  Vitals:   11/01/16 1150  BP: 100/62  Pulse: 66  Temp: 98.1 F (36.7 C)    Body mass index is 21.83 kg/m.  GENERAL: vitals reviewed and listed above, alert, oriented, appears well hydrated and in no acute distress  HEENT: atraumatic, conjunttiva clear, no obvious abnormalities on inspection of external nose and ears  NECK: no obvious masses on inspection  LUNGS: clear to auscultation bilaterally, no wheezes, rales or rhonchi, good air movement  CV: HRRR, no peripheral edema  MS: moves all extremities without noticeable abnormality  PSYCH: pleasant and cooperative, no obvious depression or anxiety  ASSESSMENT AND PLAN:  Discussed the following assessment and plan:  Hyperlipidemia, unspecified hyperlipidemia type - Plan: Lipid panel  Major depressive disorder with single episode, in full remission ([place]za), Chronic  Seasonal allergic rhinitis, unspecified trigger  -lipid check -refilled wellbutrin -  CPE in 6 months -Patient advised to return or notify a doctor immediately if symptoms worsen or persist or new concerns arise.  Patient Instructions  BEFORE YOU LEAVE: -PHQ9 -labs -follow up: CPE in 6 months  We have ordered labs or studies at this visit. It can take up to 1-2 weeks for results and processing. IF results require follow up or explanation, we will call you with instructions. Clinically stable results will be released to your [facility]. If you have not heard from [place] or cannot find your results in [facility] in 2 weeks please contact our office at [phone].  If you are not yet signed up for [facility], please  consider signing up.   We recommend the following healthy lifestyle for LIFE: 1) Small portions.   Tip: eat off of a salad plate instead of a dinner plate.  Tip: It is ok to feel hungry after a meal - that likely means you ate an appropriate portion.  Tip: if you need more or a snack choose fruits, veggies and/or a handful of nuts or seeds.  2) Eat a healthy clean diet.   TRY TO EAT: -at least 5-7 servings of low sugar vegetables per day (not corn, potatoes or bananas.) -berries are the best choice if you wish to eat fruit.   -lean meets (fish, chicken or [place] breasts) -vegan proteins for some meals - beans or tofu, whole grains, nuts and seeds -Replace bad fats with good fats - good fats include: fish, nuts and seeds, canola oil, olive oil -small amounts of low fat or non fat dairy -small amounts of100 % whole grains - check the lables  AVOID: -SUGAR, sweets, anything with added sugar, corn syrup or sweeteners -if you must have a sweetener, small amounts of stevia may be best -sweetened beverages -simple starches (rice, bread, potatoes, pasta, chips, etc - small amounts of 100% whole grains are ok) -red meat, pork, butter -fried foods, fast food, processed food, excessive dairy, eggs and coconut.  3)Get at least 150 minutes of sweaty aerobic exercise per week.  4)Reduce stress - consider counseling, meditation and relaxation to balance other aspects of your life.  WE NOW OFFER   [facility] FAST TRACK!!!  SAME DAY Appointments for ACUTE CARE  Such as: Sprains, Injuries, cuts, abrasions, rashes, muscle pain, joint pain, back pain Colds, flu, sore throats, headache, allergies, cough, fever  Ear pain, sinus and eye infections Abdominal pain, nausea, vomiting, diarrhea, upset stomach Animal/insect bites  3 Easy Ways to Schedule: Walk-In Scheduling Call in scheduling Mychart Sign-up: [email or web address]                 Colin Benton  R., DO

## 2016-11-01 ENCOUNTER — Encounter: Payer: Self-pay | Admitting: Family Medicine

## 2016-11-01 ENCOUNTER — Ambulatory Visit (INDEPENDENT_AMBULATORY_CARE_PROVIDER_SITE_OTHER): Payer: 59 | Admitting: Family Medicine

## 2016-11-01 VITALS — BP 100/62 | HR 66 | Temp 98.1°F | Ht 64.25 in | Wt 128.2 lb

## 2016-11-01 DIAGNOSIS — F325 Major depressive disorder, single episode, in full remission: Secondary | ICD-10-CM

## 2016-11-01 DIAGNOSIS — F339 Major depressive disorder, recurrent, unspecified: Secondary | ICD-10-CM | POA: Insufficient documentation

## 2016-11-01 DIAGNOSIS — J302 Other seasonal allergic rhinitis: Secondary | ICD-10-CM | POA: Diagnosis not present

## 2016-11-01 DIAGNOSIS — E785 Hyperlipidemia, unspecified: Secondary | ICD-10-CM | POA: Diagnosis not present

## 2016-11-01 LAB — LIPID PANEL
Cholesterol: 208 mg/dL — ABNORMAL HIGH (ref 0–200)
HDL: 61.9 mg/dL (ref >39.00)
LDL Cholesterol: 134 mg/dL — ABNORMAL HIGH (ref 0–99)
NONHDL: 146.57
Total CHOL/HDL Ratio: 3
Triglycerides: 62 mg/dL (ref 0.0–149.0)
VLDL: 12.4 mg/dL (ref 0.0–40.0)

## 2016-11-01 MED ORDER — BUPROPION HCL ER (XL) 150 MG PO TB24: 150.0000 mg | ORAL_TABLET | Freq: Every day | ORAL | 3 refills | Status: DC

## 2016-11-01 NOTE — Patient Instructions (Signed)
BEFORE YOU LEAVE: -PHQ9 -labs -follow up: CPE in 6 months  We have ordered labs or studies at this visit. It can take up to 1-2 weeks for results and processing. IF results require follow up or explanation, we will call you with instructions. Clinically stable results will be released to your [facility]. If you have not heard from [place] or cannot find your results in [facility] in 2 weeks please contact our office at [phone].  If you are not yet signed up for [facility], please consider signing up.   We recommend the following healthy lifestyle for LIFE: 1) Small portions.   Tip: eat off of a salad plate instead of a dinner plate.  Tip: It is ok to feel hungry after a meal - that likely means you ate an appropriate portion.  Tip: if you need more or a snack choose fruits, veggies and/or a handful of nuts or seeds.  2) Eat a healthy clean diet.   TRY TO EAT: -at least 5-7 servings of low sugar vegetables per day (not corn, potatoes or bananas.) -berries are the best choice if you wish to eat fruit.   -lean meets (fish, chicken or [place] breasts) -vegan proteins for some meals - beans or tofu, whole grains, nuts and seeds -Replace bad fats with good fats - good fats include: fish, nuts and seeds, canola oil, olive oil -small amounts of low fat or non fat dairy -small amounts of100 % whole grains - check the lables  AVOID: -SUGAR, sweets, anything with added sugar, corn syrup or sweeteners -if you must have a sweetener, small amounts of stevia may be best -sweetened beverages -simple starches (rice, bread, potatoes, pasta, chips, etc - small amounts of 100% whole grains are ok) -red meat, pork, butter -fried foods, fast food, processed food, excessive dairy, eggs and coconut.  3)Get at least 150 minutes of sweaty aerobic exercise per week.  4)Reduce stress - consider counseling, meditation and relaxation to balance other aspects of your life.  WE NOW OFFER   [facility] FAST  TRACK!!!  SAME DAY Appointments for ACUTE CARE  Such as: Sprains, Injuries, cuts, abrasions, rashes, muscle pain, joint pain, back pain Colds, flu, sore throats, headache, allergies, cough, fever  Ear pain, sinus and eye infections Abdominal pain, nausea, vomiting, diarrhea, upset stomach Animal/insect bites  3 Easy Ways to Schedule: Walk-In Scheduling Call in scheduling Mychart Sign-up: [email or web address]

## 2016-11-09 DIAGNOSIS — D225 Melanocytic nevi of trunk: Secondary | ICD-10-CM | POA: Diagnosis not present

## 2016-11-09 DIAGNOSIS — D1801 Hemangioma of skin and subcutaneous tissue: Secondary | ICD-10-CM | POA: Diagnosis not present

## 2016-11-09 DIAGNOSIS — Z872 Personal history of diseases of the skin and subcutaneous tissue: Secondary | ICD-10-CM | POA: Diagnosis not present

## 2016-11-09 DIAGNOSIS — L280 Lichen simplex chronicus: Secondary | ICD-10-CM | POA: Diagnosis not present

## 2016-11-09 DIAGNOSIS — L988 Other specified disorders of the skin and subcutaneous tissue: Secondary | ICD-10-CM | POA: Diagnosis not present

## 2016-11-09 DIAGNOSIS — L814 Other melanin hyperpigmentation: Secondary | ICD-10-CM | POA: Diagnosis not present

## 2016-11-09 DIAGNOSIS — D485 Neoplasm of uncertain behavior of skin: Secondary | ICD-10-CM | POA: Diagnosis not present

## 2016-11-11 ENCOUNTER — Encounter: Payer: Self-pay | Admitting: Family Medicine

## 2016-12-27 ENCOUNTER — Encounter: Payer: Self-pay | Admitting: Obstetrics & Gynecology

## 2017-01-24 MED FILL — BUPROPION HCL XL 150 MG TAB: 150 | 90 days supply | Qty: 90 | Fill #1

## 2017-01-27 ENCOUNTER — Encounter: Payer: Self-pay | Admitting: Obstetrics & Gynecology

## 2017-03-03 ENCOUNTER — Encounter: Payer: Self-pay | Admitting: Family Medicine

## 2017-03-03 MED FILL — RESTASIS 0.05% EYE EMULSION: 0.05 | 30 days supply | Qty: 60 | Fill #2

## 2017-03-04 MED FILL — TRETINOIN 0.025% CREAM: 0.025 | 20 days supply | Qty: 20 | Fill #0

## 2017-03-07 ENCOUNTER — Ambulatory Visit (INDEPENDENT_AMBULATORY_CARE_PROVIDER_SITE_OTHER): Payer: 59 | Admitting: Obstetrics & Gynecology

## 2017-03-07 ENCOUNTER — Encounter: Payer: Self-pay | Admitting: Obstetrics & Gynecology

## 2017-03-07 VITALS — BP 120/80 | Ht 63.0 in | Wt 129.4 lb

## 2017-03-07 DIAGNOSIS — Z1151 Encounter for screening for human papillomavirus (HPV): Secondary | ICD-10-CM | POA: Diagnosis not present

## 2017-03-07 DIAGNOSIS — Z01419 Encounter for gynecological examination (general) (routine) without abnormal findings: Secondary | ICD-10-CM | POA: Diagnosis not present

## 2017-03-07 DIAGNOSIS — Z8742 Personal history of other diseases of the female genital tract: Secondary | ICD-10-CM

## 2017-03-07 NOTE — Patient Instructions (Signed)
1. Encounter for routine gynecological examination with Papanicolaou smear of cervix Normal gynecologic exam.  Pap test with high risk HPV done.  Breast exam normal.  Will schedule screening mammogram at the breast center.  Health labs with family physician.  Continue with regular physical activity.  Takes vitamin D supplements, will check level today. - Vitamin D 1,25 dihydroxy  2. History of infertility Declines contraception.  Bethany Oliver, it was a pleasure seeing you today!  I will inform you of your results as soon as they are available.  Health Maintenance, Female Adopting a healthy lifestyle and getting preventive care can go a long way to promote health and wellness. Talk with your health care provider about what schedule of regular examinations is right for you. This is a good chance for you to check in with your provider about disease prevention and staying healthy. In between checkups, there are plenty of things you can do on your own. Experts have done a lot of research about which lifestyle changes and preventive measures are most likely to keep you healthy. Ask your health care provider for more information. Weight and diet Eat a healthy diet  Be sure to include plenty of vegetables, fruits, low-fat dairy products, and lean protein.  Do not eat a lot of foods high in solid fats, added sugars, or salt.  Get regular exercise. This is one of the most important things you can do for your health. ? Most adults should exercise for at least 150 minutes each week. The exercise should increase your heart rate and make you sweat (moderate-intensity exercise). ? Most adults should also do strengthening exercises at least twice a week. This is in addition to the moderate-intensity exercise.  Maintain a healthy weight  Body mass index (BMI) is a measurement that can be used to identify possible weight problems. It estimates body fat based on height and weight. Your health care provider can help  determine your BMI and help you achieve or maintain a healthy weight.  For females 45 years of age and older: ? A BMI below 18.5 is considered underweight. ? A BMI of 18.5 to 24.9 is normal. ? A BMI of 25 to 29.9 is considered overweight. ? A BMI of 30 and above is considered obese.  Watch levels of cholesterol and blood lipids  You should start having your blood tested for lipids and cholesterol at 45 years of age, then have this test every 5 years.  You may need to have your cholesterol levels checked more often if: ? Your lipid or cholesterol levels are high. ? You are older than 45 years of age. ? You are at high risk for heart disease.  Cancer screening Lung Cancer  Lung cancer screening is recommended for adults 18-41 years old who are at high risk for lung cancer because of a history of smoking.  A yearly low-dose CT scan of the lungs is recommended for people who: ? Currently smoke. ? Have quit within the past 15 years. ? Have at least a 30-pack-year history of smoking. A pack year is smoking an average of one pack of cigarettes a day for 1 year.  Yearly screening should continue until it has been 15 years since you quit.  Yearly screening should stop if you develop a health problem that would prevent you from having lung cancer treatment.  Breast Cancer  Practice breast self-awareness. This means understanding how your breasts normally appear and feel.  It also means doing regular breast self-exams.  Let your health care provider know about any changes, no matter how small.  If you are in your 20s or 30s, you should have a clinical breast exam (CBE) by a health care provider every 1-3 years as part of a regular health exam.  If you are 79 or older, have a CBE every year. Also consider having a breast X-ray (mammogram) every year.  If you have a family history of breast cancer, talk to your health care provider about genetic screening.  If you are at high risk for  breast cancer, talk to your health care provider about having an MRI and a mammogram every year.  Breast cancer gene (BRCA) assessment is recommended for women who have family members with BRCA-related cancers. BRCA-related cancers include: ? Breast. ? Ovarian. ? Tubal. ? Peritoneal cancers.  Results of the assessment will determine the need for genetic counseling and BRCA1 and BRCA2 testing.  Cervical Cancer Your health care provider may recommend that you be screened regularly for cancer of the pelvic organs (ovaries, uterus, and vagina). This screening involves a pelvic examination, including checking for microscopic changes to the surface of your cervix (Pap test). You may be encouraged to have this screening done every 3 years, beginning at age 40.  For women ages 70-65, health care providers may recommend pelvic exams and Pap testing every 3 years, or they may recommend the Pap and pelvic exam, combined with testing for human papilloma virus (HPV), every 5 years. Some types of HPV increase your risk of cervical cancer. Testing for HPV may also be done on women of any age with unclear Pap test results.  Other health care providers may not recommend any screening for nonpregnant women who are considered low risk for pelvic cancer and who do not have symptoms. Ask your health care provider if a screening pelvic exam is right for you.  If you have had past treatment for cervical cancer or a condition that could lead to cancer, you need Pap tests and screening for cancer for at least 20 years after your treatment. If Pap tests have been discontinued, your risk factors (such as having a new sexual partner) need to be reassessed to determine if screening should resume. Some women have medical problems that increase the chance of getting cervical cancer. In these cases, your health care provider may recommend more frequent screening and Pap tests.  Colorectal Cancer  This type of cancer can be  detected and often prevented.  Routine colorectal cancer screening usually begins at 45 years of age and continues through 45 years of age.  Your health care provider may recommend screening at an earlier age if you have risk factors for colon cancer.  Your health care provider may also recommend using home test kits to check for hidden blood in the stool.  A small camera at the end of a tube can be used to examine your colon directly (sigmoidoscopy or colonoscopy). This is done to check for the earliest forms of colorectal cancer.  Routine screening usually begins at age 65.  Direct examination of the colon should be repeated every 5-10 years through 44 years of age. However, you may need to be screened more often if early forms of precancerous polyps or small growths are found.  Skin Cancer  Check your skin from head to toe regularly.  Tell your health care provider about any new moles or changes in moles, especially if there is a change in a mole's shape or color.  Also tell your health care provider if you have a mole that is larger than the size of a pencil eraser.  Always use sunscreen. Apply sunscreen liberally and repeatedly throughout the day.  Protect yourself by wearing long sleeves, pants, a wide-brimmed hat, and sunglasses whenever you are outside.  Heart disease, diabetes, and high blood pressure  High blood pressure causes heart disease and increases the risk of stroke. High blood pressure is more likely to develop in: ? People who have blood pressure in the high end of the normal range (130-139/85-89 mm Hg). ? People who are overweight or obese. ? People who are African American.  If you are 39-32 years of age, have your blood pressure checked every 3-5 years. If you are 64 years of age or older, have your blood pressure checked every year. You should have your blood pressure measured twice-once when you are at a hospital or clinic, and once when you are not at a  hospital or clinic. Record the average of the two measurements. To check your blood pressure when you are not at a hospital or clinic, you can use: ? An automated blood pressure machine at a pharmacy. ? A home blood pressure monitor.  If you are between 81 years and 80 years old, ask your health care provider if you should take aspirin to prevent strokes.  Have regular diabetes screenings. This involves taking a blood sample to check your fasting blood sugar level. ? If you are at a normal weight and have a low risk for diabetes, have this test once every three years after 45 years of age. ? If you are overweight and have a high risk for diabetes, consider being tested at a younger age or more often. Preventing infection Hepatitis B  If you have a higher risk for hepatitis B, you should be screened for this virus. You are considered at high risk for hepatitis B if: ? You were born in a country where hepatitis B is common. Ask your health care provider which countries are considered high risk. ? Your parents were born in a high-risk country, and you have not been immunized against hepatitis B (hepatitis B vaccine). ? You have HIV or AIDS. ? You use needles to inject street drugs. ? You live with someone who has hepatitis B. ? You have had sex with someone who has hepatitis B. ? You get hemodialysis treatment. ? You take certain medicines for conditions, including cancer, organ transplantation, and autoimmune conditions.  Hepatitis C  Blood testing is recommended for: ? Everyone born from [birth year] through 1963. ? Anyone with known risk factors for hepatitis C.  Sexually transmitted infections (STIs)  You should be screened for sexually transmitted infections (STIs) including gonorrhea and chlamydia if: ? You are sexually active and are younger than 45 years of age. ? You are older than 45 years of age and your health care provider tells you that you are at risk for this type of  infection. ? Your sexual activity has changed since you were last screened and you are at an increased risk for chlamydia or gonorrhea. Ask your health care provider if you are at risk.  If you do not have HIV, but are at risk, it may be recommended that you take a prescription medicine daily to prevent HIV infection. This is called pre-exposure prophylaxis (PrEP). You are considered at risk if: ? You are sexually active and do not regularly use condoms or know the HIV status of  your partner(s). ? You take drugs by injection. ? You are sexually active with a partner who has HIV.  Talk with your health care provider about whether you are at high risk of being infected with HIV. If you choose to begin PrEP, you should first be tested for HIV. You should then be tested every 3 months for as long as you are taking PrEP. Pregnancy  If you are premenopausal and you may become pregnant, ask your health care provider about preconception counseling.  If you may become pregnant, take 400 to 800 micrograms (mcg) of folic acid every day.  If you want to prevent pregnancy, talk to your health care provider about birth control (contraception). Osteoporosis and menopause  Osteoporosis is a disease in which the bones lose minerals and strength with aging. This can result in serious bone fractures. Your risk for osteoporosis can be identified using a bone density scan.  If you are 59 years of age or older, or if you are at risk for osteoporosis and fractures, ask your health care provider if you should be screened.  Ask your health care provider whether you should take a calcium or vitamin D supplement to lower your risk for osteoporosis.  Menopause may have certain physical symptoms and risks.  Hormone replacement therapy may reduce some of these symptoms and risks. Talk to your health care provider about whether hormone replacement therapy is right for you. Follow these instructions at home:  Schedule  regular health, dental, and eye exams.  Stay current with your immunizations.  Do not use any tobacco products including cigarettes, chewing tobacco, or electronic cigarettes.  If you are pregnant, do not drink alcohol.  If you are breastfeeding, limit how much and how often you drink alcohol.  Limit alcohol intake to no more than 1 drink per day for nonpregnant women. One drink equals 12 ounces of beer, 5 ounces of wine, or 1 ounces of hard liquor.  Do not use street drugs.  Do not share needles.  Ask your health care provider for help if you need support or information about quitting drugs.  Tell your health care provider if you often feel depressed.  Tell your health care provider if you have ever been abused or do not feel safe at home. This information is not intended to replace advice given to you by your health care provider. Make sure you discuss any questions you have with your health care provider. Document Released: 08/24/2010 Document Revised: 07/17/2015 Document Reviewed: 11/12/2014 Elsevier Interactive Patient Education  [employer].

## 2017-03-07 NOTE — Addendum Note (Signed)
Addended by: Thurnell Garbe A on: 03/07/2017 12:31 PM   Modules accepted: Orders

## 2017-03-07 NOTE — Progress Notes (Signed)
Bethany Oliver 1970-03-15 [id number]   History:    47 y.o. G1P0A1 Married.  Pharmacist at [employer].  Adopted daughter is 67 yo, doing well in 96.  RP:  Established patient presenting for annual gyn exam   HPI: Normal regular menstrual cycles every month.  No pelvic pain.  Normal vaginal secretions.  No problem with intercourse.  Urine and bowel movements normal.  Breasts normal.  Health labs with family physician.  Patient is fit, runs regularly.  Taking vitamin D supplements.  Past medical history,surgical history, family history and social history were all reviewed and documented in the EPIC chart.  Gynecologic History Patient's last menstrual period was 02/20/2017. Contraception: none and Declines re h/o Primary Infertility Last Pap: 10/2014. Results were: Negative, HPV HR negative Last mammogram: 02/03/2016. Results were: Negative  Obstetric History OB History  Gravida Para Term Preterm AB Living  1 0     1 0  SAB TAB Ectopic Multiple Live Births  1            # Outcome Date GA Lbr Len/2nd Weight Sex Delivery Anes PTL Lv  1 SAB                ROS: A ROS was performed and pertinent positives and negatives are included in the history.  GENERAL: No fevers or chills. HEENT: No change in vision, no earache, sore throat or sinus congestion. NECK: No pain or stiffness. CARDIOVASCULAR: No chest pain or pressure. No palpitations. PULMONARY: No shortness of breath, cough or wheeze. GASTROINTESTINAL: No abdominal pain, nausea, vomiting or diarrhea, melena or bright red blood per rectum. GENITOURINARY: No urinary frequency, urgency, hesitancy or dysuria. MUSCULOSKELETAL: No joint or muscle pain, no back pain, no recent trauma. DERMATOLOGIC: No rash, no itching, no lesions. ENDOCRINE: No polyuria, polydipsia, no heat or cold intolerance. No recent change in weight. HEMATOLOGICAL: No anemia or easy bruising or bleeding. NEUROLOGIC: No headache, seizures, numbness, tingling or weakness.  PSYCHIATRIC: No depression, no loss of interest in normal activity or change in sleep pattern.     Exam:   BP 120/80   Ht 5\' 3"  (1.6 m)   Wt 129 lb 6.4 oz (58.7 kg)   LMP 02/20/2017 Comment: no birth control   BMI 22.92 kg/m   Body mass index is 22.92 kg/m.  General appearance : Well developed well nourished female. No acute distress HEENT: Eyes: no retinal hemorrhage or exudates,  Neck supple, trachea midline, no carotid bruits, no thyroidmegaly Lungs: Clear to auscultation, no rhonchi or wheezes, or rib retractions  Heart: Regular rate and rhythm, no murmurs or gallops Breast:Examined in sitting and supine position were symmetrical in appearance, no palpable masses or tenderness,  no skin retraction, no nipple inversion, no nipple discharge, no skin discoloration, no axillary or supraclavicular lymphadenopathy Abdomen: no palpable masses or tenderness, no rebound or guarding Extremities: no edema or skin discoloration or tenderness  Pelvic: Vulva normal  Bartholin, Urethra, Skene Glands: Within normal limits             Vagina: No gross lesions or discharge  Cervix: No gross lesions or discharge.  Pap/HR HPV done.  Uterus  AV, normal size, shape and consistency, non-tender and mobile  Adnexa  Without masses or tenderness  Anus and perineum  normal    Assessment/Plan:  47 y.o. female for annual exam   1. Encounter for routine gynecological examination with Papanicolaou smear of cervix Normal gynecologic exam.  Pap test with high risk HPV  done.  Breast exam normal.  Will schedule screening mammogram at the breast center.  Health labs with family physician.  Continue with regular physical activity.  Takes vitamin D supplements, will check level today. - Vitamin D 1,25 dihydroxy  2. History of infertility Declines contraception.  Princess Bruins MD, 12:03 PM 03/07/2017

## 2017-03-10 LAB — PAP, TP IMAGING W/ HPV RNA, RFLX HPV TYPE 16,18/45: HPV DNA HIGH RISK: NOT DETECTED

## 2017-03-10 LAB — VITAMIN D 1,25 DIHYDROXY
Vitamin D 1, 25 (OH)2 Total: 43 pg/mL (ref 18–72)
Vitamin D2 1, 25 (OH)2: <8 pg/mL
Vitamin D3 1, 25 (OH)2: 43 pg/mL

## 2017-03-14 ENCOUNTER — Encounter: Payer: Self-pay | Admitting: *Deleted

## 2017-03-21 ENCOUNTER — Other Ambulatory Visit: Payer: Self-pay | Admitting: Obstetrics & Gynecology

## 2017-03-21 DIAGNOSIS — Z1231 Encounter for screening mammogram for malignant neoplasm of breast: Secondary | ICD-10-CM

## 2017-04-11 ENCOUNTER — Ambulatory Visit
Admission: RE | Admit: 2017-04-11 | Discharge: 2017-04-11 | Disposition: A | Discharge status: Home / Self Care | Payer: 59 | Source: Ambulatory Visit | Attending: Obstetrics & Gynecology | Admitting: Obstetrics & Gynecology

## 2017-04-11 DIAGNOSIS — Z1231 Encounter for screening mammogram for malignant neoplasm of breast: Secondary | ICD-10-CM | POA: Diagnosis not present

## 2017-04-12 ENCOUNTER — Other Ambulatory Visit: Payer: Self-pay | Admitting: Obstetrics & Gynecology

## 2017-04-12 DIAGNOSIS — R928 Other abnormal and inconclusive findings on diagnostic imaging of breast: Secondary | ICD-10-CM

## 2017-04-19 ENCOUNTER — Ambulatory Visit
Admission: RE | Admit: 2017-04-19 | Discharge: 2017-04-19 | Disposition: A | Discharge status: Home / Self Care | Payer: 59 | Source: Ambulatory Visit | Attending: Obstetrics & Gynecology | Admitting: Obstetrics & Gynecology

## 2017-04-19 ENCOUNTER — Other Ambulatory Visit: Payer: Self-pay | Admitting: Obstetrics & Gynecology

## 2017-04-19 DIAGNOSIS — N6314 Unspecified lump in the right breast, lower inner quadrant: Secondary | ICD-10-CM | POA: Diagnosis not present

## 2017-04-19 DIAGNOSIS — R922 Inconclusive mammogram: Secondary | ICD-10-CM | POA: Diagnosis not present

## 2017-04-19 DIAGNOSIS — R928 Other abnormal and inconclusive findings on diagnostic imaging of breast: Secondary | ICD-10-CM

## 2017-04-19 DIAGNOSIS — N6312 Unspecified lump in the right breast, upper inner quadrant: Secondary | ICD-10-CM | POA: Diagnosis not present

## 2017-04-19 DIAGNOSIS — N6310 Unspecified lump in the right breast, unspecified quadrant: Secondary | ICD-10-CM

## 2017-04-29 ENCOUNTER — Ambulatory Visit: Payer: Self-pay | Admitting: Emergency Medicine

## 2017-04-29 VITALS — BP 108/70 | HR 60 | Temp 99.0°F

## 2017-04-29 DIAGNOSIS — J069 Acute upper respiratory infection, unspecified: Secondary | ICD-10-CM

## 2017-04-29 NOTE — Progress Notes (Signed)
Subjective:     History was provided by the patient. Bethany Oliver is a 47 y.o. female who presents for evaluation of a sore throat. Associated symptoms include nasal blockage, post nasal drip, sinus and nasal congestion and sore throat. Onset of symptoms was 2 days ago, unchanged since that time.  She is drinking plenty of fluids. She has not had recent close exposure to someone with proven streptococcal pharyngitis. Daughter does have flu, and patient is taking prophylactic dose of tamiflu.     Review of Systems Pertinent items noted in HPI and remainder of comprehensive ROS otherwise negative.    Objective:    BP 108/70 (BP Location: Right Arm, Patient Position: Sitting, Cuff Size: Normal)   Pulse 60   Temp 99 F (37.2 C) (Oral)   LMP 04/04/2017   SpO2 99%  General appearance: alert, cooperative and appears stated age Eyes: negative Ears: normal TM's and external ear canals both ears Throat: lips, mucosa, and tongue normal; teeth and gums normal and tonsils +1, no exudate Neck: no adenopathy Lungs: clear to auscultation bilaterally Heart: regular rate and rhythm Extremities: extremities normal, atraumatic, no cyanosis or edema Pulses: 2+ and symmetric    Assessment:    Viral pharyngitis.    Plan:    Use of OTC analgesics recommended as well as salt water gargles. Follow up in 1 week. Tylenol or motrin as needed, along wiht chloroseptic lozenges or throat spray

## 2017-04-29 NOTE — Patient Instructions (Signed)

## 2017-04-30 NOTE — Progress Notes (Addendum)
HPI:  Here for CPE:  Using dictation device. Unfortunately this device frequently misinterprets words/phrases.  -Concerns and/or follow up today:  Skin rash: -On shins bilaterally -Occurs annually in the winter -Itchy dry skin -A little worse this year -She believes she has tried triamcinolone cream and various moisturizers in the past, seems to get better in the summer  URI: -Nasal congestion, postnasal drip -Daughter had the flu last week -She does not feel this is the flu as she was on Tamiflu prophylaxis and does not have severe symptoms -no fever, body aches or SOB  Past Medical History:  Cold sores: -using OTC prn treatment  MDD: -mild, started in 2010 with lots of changes in life -on Wellbutrin, lower does since 2010  Seasonal allergies: -taking zyrtec  Mild HLD: -treating with lifestyle -she has cut back on red meat - want to recheck lipids  -Diet: variety of foods, balance and well rounded -Exercise: regular exercise -Taking folic acid, vitamin D or calcium: no -Diabetes and Dyslipidemia Screening: Fasting, wishes to check cholesterol today as has made some diet changes -Vaccines: see vaccine section EPIC -pap history: sees gyn, Dr. Dellis Filbert, pap 1/19 normal, hpv neg -FDLMP: see nursing notes -sexual activity: yes, female partner, no new partners -wants STI testing (Hep C if born [birth year]): no -FH breast, colon or ovarian ca: see FH Last mammogram: recent eval at breast center for abnormal mammo - following at breast center Last colon cancer screening: Not applicable Breast Ca Risk Assessment: see family history and pt history DEXA (>/= 65): Not applicable  -Alcohol, Tobacco, drug use: see social history  Review of Systems - no reported fevers, unintentional weight loss, vision loss, hearing loss, chest pain, sob, or urinary symptoms, genital concerns, changing or concerning skin lesions (except for rash mentioned above), bleeding, bruising, loc,  thoughts of self harm or SI  Past Medical History:  Diagnosis Date  . Allergy   . Depression    mild, no hospitalization  . Dry eyes     Past Surgical History:  Procedure Laterality Date  . invetro    . IVF  [id number]  . LASIK  05/2011  . LEEP  1999    Family History  Problem Relation Age of Onset  . Hypertension Mother   . Parkinson's disease Father   . Hyperlipidemia Father   . Mental illness Father   . Heart disease Maternal Grandmother   . Diabetes Maternal Grandfather   . Heart disease Maternal Grandfather     Social History   Socioeconomic History  . Marital status: Married    Spouse name: None  . Number of children: None  . Years of education: None  . Highest education level: None  Social Needs  . Financial resource strain: None  . Food insecurity - worry: None  . Food insecurity - inability: None  . Transportation needs - medical: None  . Transportation needs - non-medical: None  Occupational History  . None  Tobacco Use  . Smoking status: Never Smoker  . Smokeless tobacco: Never Used  Substance and Sexual Activity  . Alcohol use: Yes    Comment: 2 drinks a week   . Drug use: None  . Sexual activity: Yes    Partners: Male    Comment: 1st intercourse- 38, partners- 2,  married- 79 yrs   Other Topics Concern  . None  Social History Narrative   Work or School: works part time; Geologist, engineering      Home Situation: lives  with husband whom is oncologist and adopted child 2yo in 2015      Spiritual Beliefs: catholic      Lifestyle: does get regular CV exercise; diet is good              Current Outpatient Medications:  .  buPROPion (WELLBUTRIN XL) 150 MG 24 hr tablet, Take 1 tablet (150 mg total) by mouth daily., Disp: 90 tablet, Rfl: 3 .  cetirizine (ZYRTEC) 10 MG tablet, Take 10 mg by mouth daily., Disp: , Rfl:  .  RESTASIS 0.05 % ophthalmic emulsion, , Disp: , Rfl: 4 .  Tretinoin (RETIN-A EX), Apply topically at bedtime.,  Disp: , Rfl:   EXAM:  Vitals:   05/02/17 1053  BP: 110/70  Pulse: (!) 55  Temp: 97.7 F (36.5 C)    GENERAL: vitals reviewed and listed below, alert, oriented, appears well hydrated and in no acute distress  HEENT: head atraumatic, PERRLA, normal appearance of eyes, ears, nose and mouth. moist mucus membranes.  NECK: supple, no masses or lymphadenopathy  LUNGS: clear to auscultation bilaterally, no rales, rhonchi or wheeze  CV: HRRR, no peripheral edema or cyanosis, normal pedal pulses  ABDOMEN: bowel sounds normal, soft, non tender to palpation, no masses, no rebound or guarding  GU/BREAST: Does with her gynecologist  SKIN: no rash or abnormal lesions -except for dry, erythematous patch of skin on bilateral shins  MS: normal gait, moves all extremities normally  NEURO: normal gait, speech and thought processing grossly intact, muscle tone grossly intact throughout  PSYCH: normal affect, pleasant and cooperative  ASSESSMENT AND PLAN:  Discussed the following assessment and plan:  PREVENTIVE EXAM: -Reviewed and advised all [place] preventive services health task force level A and B recommendations due for age, sex and risks. -Advised at least 150 minutes of exercise per week and a healthy diet see patient instructions for summary-  -labs, studies and vaccines per orders this encounter -Sees GYN for gynecology exams  2. Hyperlipidemia, unspecified hyperlipidemia type -She has changed her diet some, wants to check a fasting cholesterol today   3. Upper respiratory symptom -query allergies or mild respiratory illness or even mild flu -Symptomatic care, follow-up as needed  4. Skin rash -Discussed likely etiologies, likely eczema -Advised low potency over-the-counter steroid and good emollient, along with a hypoallergenic regimen -She sees a dermatologist and if it is not improving she plans to see them -Otherwise, advised her to let us know  5.Depression -See PHQ  9 -Stable, continue treatment  Patient Instructions  BEFORE YOU LEAVE: -Labs -follow up: Yearly and as needed  For the skin rash:  Try hydrocortisone twice daily and a good emollient such as Aquaphor twice daily.  Also use a hypoallergenic regimen in terms of your soap, laundry detergent, etc. Please follow-up with your dermatologist if this persists.  We have ordered labs or studies at this visit. It can take up to 1-2 weeks for results and processing. IF results require follow up or explanation, we will call you with instructions. Clinically stable results will be released to your [facility]. If you have not heard from [place] or cannot find your results in [facility] in 2 weeks please contact our office at [phone].  If you are not yet signed up for [facility], please consider signing up.   Preventive Care 40-64 Years, Female Preventive care refers to lifestyle choices and visits with your health care provider that can promote health and wellness. What does preventive care include?  A yearly  physical exam. This is also called an annual well check.  Dental exams once or twice a year.  Routine eye exams. Ask your health care provider how often you should have your eyes checked.  Personal lifestyle choices, including: ? Daily care of your teeth and gums. ? Regular physical activity. ? Eating a healthy diet. ? Avoiding tobacco and drug use. ? Limiting alcohol use. ? Practicing safe sex. ? Taking low-dose aspirin daily starting at age 61. ? Taking vitamin and mineral supplements as recommended by your health care provider. What happens during an annual well check? The services and screenings done by your health care provider during your annual well check will depend on your age, overall health, lifestyle risk factors, and family history of disease. Counseling Your health care provider may ask you questions about your:  Alcohol use.  Tobacco use.  Drug use.  Emotional  well-being.  Home and relationship well-being.  Sexual activity.  Eating habits.  Work and work Statistician.  Method of birth control.  Menstrual cycle.  Pregnancy history.  Screening You may have the following tests or measurements:  Height, weight, and BMI.  Blood pressure.  Lipid and cholesterol levels. These may be checked every 5 years, or more frequently if you are over 84 years old.  Skin check.  Lung cancer screening. You may have this screening every year starting at age 47 if you have a 30-pack-year history of smoking and currently smoke or have quit within the past 15 years.  Fecal occult blood test (FOBT) of the stool. You may have this test every year starting at age 92.  Flexible sigmoidoscopy or colonoscopy. You may have a sigmoidoscopy every 5 years or a colonoscopy every 10 years starting at age 13.  Hepatitis C blood test.  Hepatitis B blood test.  Sexually transmitted disease (STD) testing.  Diabetes screening. This is done by checking your blood sugar (glucose) after you have not eaten for a while (fasting). You may have this done every 1-3 years.  Mammogram. This may be done every 1-2 years. Talk to your health care provider about when you should start having regular mammograms. This may depend on whether you have a family history of breast cancer.  BRCA-related cancer screening. This may be done if you have a family history of breast, ovarian, tubal, or peritoneal cancers.  Pelvic exam and Pap test. This may be done every 3 years starting at age 62. Starting at age 66, this may be done every 5 years if you have a Pap test in combination with an HPV test.  Bone density scan. This is done to screen for osteoporosis. You may have this scan if you are at high risk for osteoporosis.  Discuss your test results, treatment options, and if necessary, the need for more tests with your health care provider. Vaccines Your health care provider may recommend  certain vaccines, such as:  Influenza vaccine. This is recommended every year.  Tetanus, diphtheria, and acellular pertussis (Tdap, Td) vaccine. You may need a Td booster every 10 years.  Varicella vaccine. You may need this if you have not been vaccinated.  Zoster vaccine. You may need this after age 23.  Measles, mumps, and rubella (MMR) vaccine. You may need at least one dose of MMR if you were born in 1957 or later. You may also need a second dose.  Pneumococcal 13-valent conjugate (PCV13) vaccine. You may need this if you have certain conditions and were not previously vaccinated.  Pneumococcal polysaccharide (PPSV23) vaccine. You may need one or two doses if you smoke cigarettes or if you have certain conditions.  Meningococcal vaccine. You may need this if you have certain conditions.  Hepatitis A vaccine. You may need this if you have certain conditions or if you travel or work in places where you may be exposed to hepatitis A.  Hepatitis B vaccine. You may need this if you have certain conditions or if you travel or work in places where you may be exposed to hepatitis B.  Haemophilus influenzae type b (Hib) vaccine. You may need this if you have certain conditions.  Talk to your health care provider about which screenings and vaccines you need and how often you need them. This information is not intended to replace advice given to you by your health care provider. Make sure you discuss any questions you have with your health care provider. Document Released: 03/07/2015 Document Revised: 10/29/2015 Document Reviewed: 12/10/2014 Elsevier Interactive Patient Education  2018 [employer].          No Follow-up on file.  Lucretia Kern, DO

## 2017-05-02 ENCOUNTER — Encounter: Payer: Self-pay | Admitting: Family Medicine

## 2017-05-02 ENCOUNTER — Ambulatory Visit (INDEPENDENT_AMBULATORY_CARE_PROVIDER_SITE_OTHER): Payer: 59 | Admitting: Family Medicine

## 2017-05-02 VITALS — BP 110/70 | HR 55 | Temp 97.7°F | Ht 64.0 in | Wt 128.2 lb

## 2017-05-02 DIAGNOSIS — Z0000 Encounter for general adult medical examination without abnormal findings: Secondary | ICD-10-CM | POA: Diagnosis not present

## 2017-05-02 DIAGNOSIS — F329 Major depressive disorder, single episode, unspecified: Secondary | ICD-10-CM

## 2017-05-02 DIAGNOSIS — F325 Major depressive disorder, single episode, in full remission: Secondary | ICD-10-CM

## 2017-05-02 DIAGNOSIS — E785 Hyperlipidemia, unspecified: Secondary | ICD-10-CM | POA: Diagnosis not present

## 2017-05-02 DIAGNOSIS — R21 Rash and other nonspecific skin eruption: Secondary | ICD-10-CM

## 2017-05-02 DIAGNOSIS — R0989 Other specified symptoms and signs involving the circulatory and respiratory systems: Secondary | ICD-10-CM

## 2017-05-02 LAB — LIPID PANEL
CHOLESTEROL: 159 mg/dL (ref 0–200)
HDL: 48.5 mg/dL (ref >39.00)
LDL Cholesterol: 97 mg/dL (ref 0–99)
NonHDL: 110.65
TRIGLYCERIDES: 67 mg/dL (ref 0.0–149.0)
Total CHOL/HDL Ratio: 3
VLDL: 13.4 mg/dL (ref 0.0–40.0)

## 2017-05-02 NOTE — Patient Instructions (Addendum)
BEFORE YOU LEAVE: -Labs -follow up: Yearly and as needed  For the skin rash:  Try hydrocortisone twice daily and a good emollient such as Aquaphor twice daily.  Also use a hypoallergenic regimen in terms of your soap, laundry detergent, etc. Please follow-up with your dermatologist if this persists.  We have ordered labs or studies at this visit. It can take up to 1-2 weeks for results and processing. IF results require follow up or explanation, we will call you with instructions. Clinically stable results will be released to your [facility]. If you have not heard from [place] or cannot find your results in [facility] in 2 weeks please contact our office at [phone].  If you are not yet signed up for [facility], please consider signing up.   Preventive Care 40-64 Years, Female Preventive care refers to lifestyle choices and visits with your health care provider that can promote health and wellness. What does preventive care include?  A yearly physical exam. This is also called an annual well check.  Dental exams once or twice a year.  Routine eye exams. Ask your health care provider how often you should have your eyes checked.  Personal lifestyle choices, including: ? Daily care of your teeth and gums. ? Regular physical activity. ? Eating a healthy diet. ? Avoiding tobacco and drug use. ? Limiting alcohol use. ? Practicing safe sex. ? Taking low-dose aspirin daily starting at age 51. ? Taking vitamin and mineral supplements as recommended by your health care provider. What happens during an annual well check? The services and screenings done by your health care provider during your annual well check will depend on your age, overall health, lifestyle risk factors, and family history of disease. Counseling Your health care provider may ask you questions about your:  Alcohol use.  Tobacco use.  Drug use.  Emotional well-being.  Home and relationship well-being.  Sexual  activity.  Eating habits.  Work and work Statistician.  Method of birth control.  Menstrual cycle.  Pregnancy history.  Screening You may have the following tests or measurements:  Height, weight, and BMI.  Blood pressure.  Lipid and cholesterol levels. These may be checked every 5 years, or more frequently if you are over 63 years old.  Skin check.  Lung cancer screening. You may have this screening every year starting at age 65 if you have a 30-pack-year history of smoking and currently smoke or have quit within the past 15 years.  Fecal occult blood test (FOBT) of the stool. You may have this test every year starting at age 44.  Flexible sigmoidoscopy or colonoscopy. You may have a sigmoidoscopy every 5 years or a colonoscopy every 10 years starting at age 21.  Hepatitis C blood test.  Hepatitis B blood test.  Sexually transmitted disease (STD) testing.  Diabetes screening. This is done by checking your blood sugar (glucose) after you have not eaten for a while (fasting). You may have this done every 1-3 years.  Mammogram. This may be done every 1-2 years. Talk to your health care provider about when you should start having regular mammograms. This may depend on whether you have a family history of breast cancer.  BRCA-related cancer screening. This may be done if you have a family history of breast, ovarian, tubal, or peritoneal cancers.  Pelvic exam and Pap test. This may be done every 3 years starting at age 60. Starting at age 70, this may be done every 5 years if you have a  Pap test in combination with an HPV test.  Bone density scan. This is done to screen for osteoporosis. You may have this scan if you are at high risk for osteoporosis.  Discuss your test results, treatment options, and if necessary, the need for more tests with your health care provider. Vaccines Your health care provider may recommend certain vaccines, such as:  Influenza vaccine. This is  recommended every year.  Tetanus, diphtheria, and acellular pertussis (Tdap, Td) vaccine. You may need a Td booster every 10 years.  Varicella vaccine. You may need this if you have not been vaccinated.  Zoster vaccine. You may need this after age 77.  Measles, mumps, and rubella (MMR) vaccine. You may need at least one dose of MMR if you were born in 1957 or later. You may also need a second dose.  Pneumococcal 13-valent conjugate (PCV13) vaccine. You may need this if you have certain conditions and were not previously vaccinated.  Pneumococcal polysaccharide (PPSV23) vaccine. You may need one or two doses if you smoke cigarettes or if you have certain conditions.  Meningococcal vaccine. You may need this if you have certain conditions.  Hepatitis A vaccine. You may need this if you have certain conditions or if you travel or work in places where you may be exposed to hepatitis A.  Hepatitis B vaccine. You may need this if you have certain conditions or if you travel or work in places where you may be exposed to hepatitis B.  Haemophilus influenzae type b (Hib) vaccine. You may need this if you have certain conditions.  Talk to your health care provider about which screenings and vaccines you need and how often you need them. This information is not intended to replace advice given to you by your health care provider. Make sure you discuss any questions you have with your health care provider. Document Released: 03/07/2015 Document Revised: 10/29/2015 Document Reviewed: 12/10/2014 Elsevier Interactive Patient Education  [employer].

## 2017-05-04 ENCOUNTER — Encounter: Payer: Self-pay | Admitting: Family Medicine

## 2017-05-04 MED FILL — buPROPion HCL ER (XL) 150 M: 150 | 90 days supply | Qty: 90 | Fill #0

## 2017-05-05 ENCOUNTER — Ambulatory Visit: Payer: Self-pay | Admitting: Nurse Practitioner

## 2017-05-05 ENCOUNTER — Telehealth: Payer: 59 | Admitting: Physician Assistant

## 2017-05-05 VITALS — BP 118/78 | HR 65 | Temp 97.5°F | Resp 20 | Wt 131.4 lb

## 2017-05-05 DIAGNOSIS — B9689 Other specified bacterial agents as the cause of diseases classified elsewhere: Secondary | ICD-10-CM

## 2017-05-05 DIAGNOSIS — J040 Acute laryngitis: Secondary | ICD-10-CM

## 2017-05-05 DIAGNOSIS — J029 Acute pharyngitis, unspecified: Secondary | ICD-10-CM

## 2017-05-05 DIAGNOSIS — J208 Acute bronchitis due to other specified organisms: Secondary | ICD-10-CM | POA: Diagnosis not present

## 2017-05-05 LAB — POCT RAPID STREP A (OFFICE): Rapid Strep A Screen: NEGATIVE

## 2017-05-05 MED ORDER — BUPROPION HCL ER (XL) 150 MG PO TB24: 150.0000 mg | ORAL_TABLET | Freq: Every day | ORAL | 3 refills | Status: DC

## 2017-05-05 MED ORDER — AZITHROMYCIN 250 MG PO TABS: ORAL_TABLET | ORAL | 0 refills | Status: DC

## 2017-05-05 MED ORDER — MAGIC MOUTHWASH W/LIDOCAINE: 5.0000 mL | Freq: Three times a day (TID) | ORAL | 0 refills | Status: AC | PRN

## 2017-05-05 MED ORDER — PSEUDOEPH-BROMPHEN-DM 30-2-10 MG/5ML PO SYRP
5.0000 mL | ORAL_SOLUTION | Freq: Four times a day (QID) | ORAL | 0 refills | Status: AC | PRN
Start: 2017-05-05 — End: 2017-05-10

## 2017-05-05 MED FILL — AZITHROMYCIN 250 MG TABLET: 250 | 5 days supply | Qty: 6 | Fill #0

## 2017-05-05 NOTE — Progress Notes (Signed)
We are sorry that you are not feeling well.  Here is how we plan to help!  Based on your presentation I believe you most likely have A cough due to bacteria.  When patients have a fever and a productive cough with a change in color or increased sputum production, we are concerned about bacterial bronchitis.  If left untreated it can progress to pneumonia.  If your symptoms do not improve with your treatment plan it is important that you contact your provider.   I have prescribed Azithromyin 250 mg: two tablets now and then one tablet daily for 4 additonal days    In addition you may use A non-prescription cough medication called  Delsym: take 2 teaspoons every 12 hours.   From your responses in the eVisit questionnaire you describe inflammation in the upper respiratory tract which is causing a significant cough.  This is commonly called Bronchitis and has four common causes:    Allergies  Viral Infections  Acid Reflux  Bacterial Infection Allergies, viruses and acid reflux are treated by controlling symptoms or eliminating the cause. An example might be a cough caused by taking certain blood pressure medications. You stop the cough by changing the medication. Another example might be a cough caused by acid reflux. Controlling the reflux helps control the cough.  USE OF BRONCHODILATOR ("RESCUE") INHALERS: There is a risk from using your bronchodilator too frequently.  The risk is that over-reliance on a medication which only relaxes the muscles surrounding the breathing tubes can reduce the effectiveness of medications prescribed to reduce swelling and congestion of the tubes themselves.  Although you feel brief relief from the bronchodilator inhaler, your asthma may actually be worsening with the tubes becoming more swollen and filled with mucus.  This can delay other crucial treatments, such as oral steroid medications. If you need to use a bronchodilator inhaler daily, several times per day, you  should discuss this with your provider.  There are probably better treatments that could be used to keep your asthma under control.     HOME CARE . Only take medications as instructed by your medical team. . Complete the entire course of an antibiotic. . Drink plenty of fluids and get plenty of rest. . Avoid close contacts especially the very young and the elderly . Cover your mouth if you cough or cough into your sleeve. . Always remember to wash your hands . A steam or ultrasonic humidifier can help congestion.   GET HELP RIGHT AWAY IF: . You develop worsening fever. . You become short of breath . You cough up blood. . Your symptoms persist after you have completed your treatment plan MAKE SURE YOU   Understand these instructions.  Will watch your condition.  Will get help right away if you are not doing well or get worse.  Your e-visit answers were reviewed by a board certified advanced clinical practitioner to complete your personal care plan.  Depending on the condition, your plan could have included both over the counter or prescription medications. If there is a problem please reply  once you have received a response from your provider. Your safety is important to [place].  If you have drug allergies check your prescription carefully.    You can use MyChart to ask questions about today's visit, request a non-urgent call back, or ask for a work or school excuse for 24 hours related to this e-Visit. If it has been greater than 24 hours you will need to  follow up with your provider, or enter a new e-Visit to address those concerns. You will get an e-mail in the next two days asking about your experience.  I hope that your e-visit has been valuable and will speed your recovery. Thank you for using e-visits.   

## 2017-05-05 NOTE — Progress Notes (Signed)
Subjective:     Bethany Oliver is a 47 y.o. female who presents for evaluation of sore throat. Associated symptoms include nasal blockage, post nasal drip, sinus and nasal congestion, sore throat and swollen glands. Onset of symptoms was 10 days ago, and have been gradually worsening since that time. She is drinking plenty of fluids. She has not had a recent close exposure to someone with proven streptococcal pharyngitis.  Patient did complete an e-visit and prescribed azithromycin today, which she has not started.   The following portions of the patient's history were reviewed and updated as appropriate: allergies, current medications and past medical history.  Review of Systems Constitutional: positive for anorexia and fatigue, negative for chills, malaise and sweats Eyes: negative Ears, nose, mouth, throat, and face: positive for hoarseness, nasal congestion and sore throat, negative for ear drainage and earaches Respiratory: positive for cough, negative for asthma, dyspnea on exertion, stridor and wheezing Cardiovascular: negative Gastrointestinal: positive for decreased appetite, negative for constipation, diarrhea, nausea and vomiting Neurological: positive for headaches, negative for coordination problems, dizziness, paresthesia, vertigo and weakness Allergic/Immunologic: positive for hay fever    Objective:    BP 118/78 (BP Location: Right Arm, Patient Position: Sitting, Cuff Size: Normal)   Pulse 65   Temp (!) 97.5 F (36.4 C) (Oral)   Resp 20   Wt 131 lb 6.4 oz (59.6 kg)   SpO2 99%   BMI 22.55 kg/m  General appearance: alert, cooperative, fatigued, no distress and patient speaking in complete sentences Head: Normocephalic, without obvious abnormality, atraumatic Eyes: conjunctivae/corneas clear. PERRL, EOM's intact. Fundi benign. Ears: normal TM's and external ear canals both ears Nose: clear discharge, turbinates swollen, inflamed, no sinus tenderness Throat: abnormal  findings: moderate oropharyngeal erythema Lungs: clear to auscultation bilaterally Heart: regular rate and rhythm, S1, S2 normal, no murmur, click, rub or gallop Abdomen: soft, non-tender; bowel sounds normal; no masses,  no organomegaly Pulses: 2+ and symmetric Skin: Skin color, texture, turgor normal. No rashes or lesions Lymph nodes: cervical lymphadenopathy bilaterally Neurologic: Grossly normal  Laboratory Strep test done. Results:negative.    Assessment:   Acute Upper Respiratory Infection, Layrngitis  Plan:    Use of OTC analgesics recommended as well as salt water gargles. Follow up as needed. Patient instructed to use Magic Mouthwash for throat pain.  Patient will use antibiotic given during e-visit.  Patient instructed to continue use of OTC remedies, Ibuprofen, cough drops, saltwater gargles.  Bromfed given for cough.  Patient verbalizes understanding. Will follow up as needed.

## 2017-05-05 NOTE — Patient Instructions (Addendum)
Laryngitis Laryngitis is inflammation of your vocal cords. This causes hoarseness, coughing, loss of voice, sore throat, or a dry throat. Your vocal cords are two bands of muscles that are found in your throat. When you speak, these cords come together and vibrate. These vibrations come out through your mouth as sound. When your vocal cords are inflamed, your voice sounds different. Laryngitis can be temporary (acute) or long-term (chronic). Most cases of acute laryngitis improve with time. Chronic laryngitis is laryngitis that lasts for more than three weeks. What are the causes? Acute laryngitis may be caused by:  A viral infection.  Lots of talking, yelling, or singing. This is also called vocal strain.  Bacterial infections.  Chronic laryngitis may be caused by:  Vocal strain.  Injury to your vocal cords.  Acid reflux (gastroesophageal reflux disease or GERD).  Allergies.  Sinus infection.  Smoking.  Alcohol abuse.  Breathing in chemicals or dust.  Growths on the vocal cords.  What increases the risk? Risk factors for laryngitis include:  Smoking.  Alcohol abuse.  Having allergies.  What are the signs or symptoms? Symptoms of laryngitis may include:  Low, hoarse voice.  Loss of voice.  Dry cough.  Sore throat.  Stuffy nose.  How is this diagnosed? Laryngitis may be diagnosed by:  Physical exam.  Throat culture.  Blood test.  Laryngoscopy. This procedure allows your health care provider to look at your vocal cords with a mirror or viewing tube.  How is this treated? Treatment for laryngitis depends on what is causing it. Usually, treatment involves resting your voice and using medicines to soothe your throat. However, if your laryngitis is caused by a bacterial infection, you may need to take antibiotic medicine. If your laryngitis is caused by a growth, you may need to have a procedure to remove it. Follow these instructions at home:  Drink  enough fluid to keep your urine clear or pale yellow.  Breathe in moist air. Use a humidifier if you live in a dry climate.  Take medicines only as directed by your health care provider.  If you were prescribed an antibiotic medicine, finish it all even if you start to feel better.  Do not smoke cigarettes or electronic cigarettes. If you need help quitting, ask your health care provider.  Talk as little as possible. Also avoid whispering, which can cause vocal strain.  Write instead of talking. Do this until your voice is back to normal. Contact a health care provider if:  You have a fever.  You have increasing pain.  You have difficulty swallowing. Get help right away if:  You cough up blood.  You have trouble breathing. This information is not intended to replace advice given to you by your health care provider. Make sure you discuss any questions you have with your health care provider. Document Released: 02/08/2005 Document Revised: 07/17/2015 Document Reviewed: 07/24/2013 Elsevier Interactive Patient Education  2018 Elsevier Inc.  

## 2017-06-02 DIAGNOSIS — H04123 Dry eye syndrome of bilateral lacrimal glands: Secondary | ICD-10-CM | POA: Diagnosis not present

## 2017-07-19 MED FILL — RESTASIS 0.05% EYE EMULSION: 0.05 | 30 days supply | Qty: 60 | Fill #0

## 2017-07-19 MED FILL — BUPROPION HCL XL 150 MG TAB: 150 | 90 days supply | Qty: 90 | Fill #1 | Status: TO

## 2017-07-19 MED FILL — TRETINOIN 0.025% CREAM: 0.025 | 20 days supply | Qty: 20 | Fill #1 | Status: TO

## 2017-10-20 ENCOUNTER — Other Ambulatory Visit: Payer: 59

## 2017-11-04 ENCOUNTER — Ambulatory Visit
Admission: RE | Admit: 2017-11-04 | Discharge: 2017-11-04 | Disposition: A | Discharge status: Home / Self Care | Payer: PRIVATE HEALTH INSURANCE | Source: Ambulatory Visit | Attending: Obstetrics & Gynecology | Admitting: Obstetrics & Gynecology

## 2017-11-04 ENCOUNTER — Other Ambulatory Visit: Payer: Self-pay | Admitting: Obstetrics & Gynecology

## 2017-11-04 DIAGNOSIS — N6310 Unspecified lump in the right breast, unspecified quadrant: Secondary | ICD-10-CM

## 2017-11-17 ENCOUNTER — Other Ambulatory Visit: Payer: Self-pay | Admitting: Family Medicine

## 2018-05-08 ENCOUNTER — Encounter: Payer: PRIVATE HEALTH INSURANCE | Admitting: Family Medicine

## 2018-05-09 MED ORDER — BUPROPION HCL ER (XL) 150 MG PO TB24: 150.0000 mg | ORAL_TABLET | Freq: Every day | ORAL | 0 refills | Status: DC

## 2018-05-09 NOTE — Addendum Note (Signed)
Addended by: Agnes Lawrence on: 05/09/2018 04:16 PM   Modules accepted: Orders

## 2018-05-09 NOTE — Telephone Encounter (Signed)
Rx done. 

## 2018-05-09 NOTE — Telephone Encounter (Signed)
Pt states medication not received by pharmacy. Requesting the medicine to be resent.

## 2018-05-10 ENCOUNTER — Encounter: Payer: Self-pay | Admitting: Obstetrics & Gynecology

## 2018-05-10 ENCOUNTER — Other Ambulatory Visit: Payer: Self-pay

## 2018-05-10 ENCOUNTER — Ambulatory Visit (INDEPENDENT_AMBULATORY_CARE_PROVIDER_SITE_OTHER): Payer: PRIVATE HEALTH INSURANCE | Admitting: Obstetrics & Gynecology

## 2018-05-10 VITALS — BP 132/82 | Ht 64.0 in | Wt 130.0 lb

## 2018-05-10 DIAGNOSIS — Z308 Encounter for other contraceptive management: Secondary | ICD-10-CM | POA: Diagnosis not present

## 2018-05-10 DIAGNOSIS — Z01419 Encounter for gynecological examination (general) (routine) without abnormal findings: Secondary | ICD-10-CM | POA: Diagnosis not present

## 2018-05-10 NOTE — Progress Notes (Signed)
Bethany Oliver March 22, 1970 [id number]   History:    48 y.o. G1P0A1 Married.  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  RP:  Established patient presenting for annual gyn exam   HPI: Menstrual periods every month with normal flow.  No breakthrough bleeding.  No pelvic pain.  Reports increased headaches post menses, controlled with ibuprofen.  No pain with intercourse.  Urine and bowel movements normal.  Breast normal.  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  Body mass index 22.31.  Running on a regular basis.  Healthy nutrition.  Health labs with family physician.  Past medical history,surgical history, family history and social history were all reviewed and documented in the EPIC chart.  Gynecologic History Patient's last menstrual period was 04/09/2018. Contraception: History of infertility, declines contraception Last Pap: 02/2017. Results were: Negative/HPV HR negative Last mammogram: 03/2017. Results were: Benign.  Followed q6 months for a Rt breast small FibroAdenoma. Bone Density: Never Colonoscopy: Never  Obstetric History OB History  Gravida Para Term Preterm AB Living  1 0     1 0  SAB TAB Ectopic Multiple Live Births  1            # Outcome Date GA Lbr Len/2nd Weight Sex Delivery Anes PTL Lv  1 SAB              ROS: A ROS was performed and pertinent positives and negatives are included in the history.  GENERAL: No fevers or chills. HEENT: No change in vision, no earache, sore throat or sinus congestion. NECK: No pain or stiffness. CARDIOVASCULAR: No chest pain or pressure. No palpitations. PULMONARY: No shortness of breath, cough or wheeze. GASTROINTESTINAL: No abdominal pain, nausea, vomiting or diarrhea, melena or bright red blood per rectum. GENITOURINARY: No urinary frequency, urgency, hesitancy or dysuria. MUSCULOSKELETAL: No joint or muscle pain, no back pain, no recent trauma. DERMATOLOGIC:  No rash, no itching, no lesions. ENDOCRINE: No polyuria, polydipsia, no heat or cold intolerance. No recent change in weight. HEMATOLOGICAL: No anemia or easy bruising or bleeding. NEUROLOGIC: No headache, seizures, numbness, tingling or weakness. PSYCHIATRIC: No depression, no loss of interest in normal activity or change in sleep pattern.     Exam:   BP 132/82   Ht 5\' 4"  (1.626 m)   Wt 130 lb (59 kg)   LMP 04/09/2018   BMI 22.31 kg/m   Body mass index is 22.31 kg/m.  General appearance : Well developed well nourished female. No acute distress HEENT: Eyes: no retinal hemorrhage or exudates,  Neck supple, trachea midline, no carotid bruits, no thyroidmegaly Lungs: Clear to auscultation, no rhonchi or wheezes, or rib retractions  Heart: Regular rate and rhythm, no murmurs or gallops Breast:Examined in sitting and supine position were symmetrical in appearance, no palpable masses or tenderness,  no skin retraction, no nipple inversion, no nipple discharge, no skin discoloration, no axillary or supraclavicular lymphadenopathy Abdomen: no palpable masses or tenderness, no rebound or guarding Extremities: no edema or skin discoloration or tenderness  Pelvic: Vulva: Normal             Vagina: No gross lesions or discharge  Cervix: No gross lesions or discharge  Uterus  AV, normal size, shape and consistency, non-tender and mobile  Adnexa  Without masses or tenderness  Anus: Normal   Assessment/Plan:  48 y.o. female for annual exam   1. Well female exam with routine gynecological exam  Normal gynecologic exam.  Pap test in January 2019 was negative with negative high-risk HPV, no indication to repeat this year.  Breast exam normal.  Screening mammogram scheduled tomorrow.  Also followed every 6 months for right breast small fibroadenoma.  Good body mass index at 22.31.  Continue with fitness and healthy nutrition.  Good vitamin D level last year.  Health labs with family physician.  2.  Encounter for other contraceptive management History of primary infertility, declines contraception.  Princess Bruins MD, 3:09 PM 05/10/2018

## 2018-05-10 NOTE — Patient Instructions (Signed)
1. Well female exam with routine gynecological exam Normal gynecologic exam.  Pap test in January 2019 was negative with negative high-risk HPV, no indication to repeat this year.  Breast exam normal.  Screening mammogram scheduled tomorrow.  Also followed every 6 months for right breast small fibroadenoma.  Good body mass index at 22.31.  Continue with fitness and healthy nutrition.  Good vitamin D level last year.  Health labs with family physician.  2. Encounter for other contraceptive management History of primary infertility, declines contraception.  Bethany Oliver, it was a pleasure seeing you today!

## 2018-05-11 ENCOUNTER — Ambulatory Visit
Admission: RE | Admit: 2018-05-11 | Discharge: 2018-05-11 | Disposition: A | Discharge status: Home / Self Care | Payer: PRIVATE HEALTH INSURANCE | Source: Ambulatory Visit | Attending: Obstetrics & Gynecology | Admitting: Obstetrics & Gynecology

## 2018-05-11 ENCOUNTER — Encounter: Payer: PRIVATE HEALTH INSURANCE | Admitting: Family Medicine

## 2018-05-11 ENCOUNTER — Other Ambulatory Visit: Payer: PRIVATE HEALTH INSURANCE

## 2018-05-11 DIAGNOSIS — N6310 Unspecified lump in the right breast, unspecified quadrant: Secondary | ICD-10-CM

## 2018-06-06 ENCOUNTER — Encounter: Payer: PRIVATE HEALTH INSURANCE | Admitting: Family Medicine

## 2018-06-13 ENCOUNTER — Encounter: Payer: Self-pay | Admitting: Family Medicine

## 2018-06-13 ENCOUNTER — Encounter: Payer: PRIVATE HEALTH INSURANCE | Admitting: Family Medicine

## 2018-06-13 ENCOUNTER — Other Ambulatory Visit: Payer: Self-pay

## 2018-06-13 ENCOUNTER — Ambulatory Visit (INDEPENDENT_AMBULATORY_CARE_PROVIDER_SITE_OTHER): Payer: PRIVATE HEALTH INSURANCE | Admitting: Family Medicine

## 2018-06-13 DIAGNOSIS — E785 Hyperlipidemia, unspecified: Secondary | ICD-10-CM | POA: Diagnosis not present

## 2018-06-13 DIAGNOSIS — J302 Other seasonal allergic rhinitis: Secondary | ICD-10-CM | POA: Diagnosis not present

## 2018-06-13 DIAGNOSIS — F325 Major depressive disorder, single episode, in full remission: Secondary | ICD-10-CM | POA: Diagnosis not present

## 2018-06-13 MED ORDER — BUPROPION HCL ER (XL) 150 MG PO TB24
150.0000 mg | ORAL_TABLET | Freq: Every day | ORAL | 0 refills | Status: DC
Start: 2018-06-13 — End: 2018-09-22

## 2018-06-13 NOTE — Progress Notes (Signed)
Virtual Visit via Video Note  I connected with Bethany Oliver  on 06/13/18 at 11:30 AM EDT by a video enabled telemedicine application and verified that I am speaking with the correct person using two identifiers.  Location patient: home Location provider:work or home office Persons participating in the virtual visit: patient, provider  I discussed the limitations of evaluation and management by telemedicine and the availability of in person appointments. The patient expressed understanding and agreed to proceed.   HPI:  Bethany Oliver is a pleasant 48 y.o. here for follow up. Chronic medical problems summarized below were reviewed for changes and stability and were updated as needed below. These issues and their treatment remain stable for the most part. Reports mood is good and wishes to continue wellbutrin, requests refills. Continues use zyrtec and nasocort for allergies - more nasal congestion with spring allergies, but is doing ok. She had lipids (non fasting ) for life insurance about 3 months ago. Reports they were elevated. Has been working on [employer] and gets regular aerobic exercise.   MDD: -mild, started in 2010 with lots of changes in life -on Wellbutrin, lower does since 2010  Seasonal allergies: -taking zyrtec  Mild HLD: -treating with lifestyle -she has cut back on red meat - want to recheck lipids  Cold sores: -uses OTC prn treatment   ROS: See pertinent positives and negatives per HPI.  Past Medical History:  Diagnosis Date  . Allergy   . Depression    mild, no hospitalization  . Dry eyes     Past Surgical History:  Procedure Laterality Date  . invetro    . IVF  2008-20012  . LASIK  05/2011  . LEEP  1999    Family History  Problem Relation Age of Onset  . Hypertension Mother   . Parkinson's disease Father   . Hyperlipidemia Father   . Mental illness Father   . Heart disease Maternal Grandmother   . Diabetes Maternal Grandfather   .  Heart disease Maternal Grandfather     SOCIAL HX: see hpi   Current Outpatient Medications:  .  buPROPion (WELLBUTRIN XL) 150 MG 24 hr tablet, Take 1 tablet (150 mg total) by mouth daily., Disp: 90 tablet, Rfl: 0 .  cetirizine (ZYRTEC) 10 MG tablet, Take 10 mg by mouth daily., Disp: , Rfl:  .  RESTASIS 0.05 % ophthalmic emulsion, , Disp: , Rfl: 4 .  Tretinoin (RETIN-A EX), Apply topically at bedtime., Disp: , Rfl:   EXAM:  VITALS per patient if applicable:  GENERAL: alert, oriented, appears well and in no acute distress  HEENT: atraumatic, conjunttiva clear, no obvious abnormalities on inspection of external nose and ears  NECK: normal movements of the head and neck  LUNGS: on inspection no signs of respiratory distress, breathing rate appears normal, no obvious gross SOB, gasping or wheezing  CV: no obvious cyanosis  MS: moves all visible extremities without noticeable abnormality  PSYCH/NEURO: pleasant and cooperative, no obvious depression or anxiety, speech and thought processing grossly intact  ASSESSMENT AND PLAN:  Discussed the following assessment and plan:  Major depressive disorder with single episode, in full remission ([place]) -refilled wellburtin -glad is doing well -follow up/TOC with Dr. Ethlyn Gallery in 3 months  Seasonal allergic rhinitis, unspecified trigger -cont current regimen  Hyperlipidemia, unspecified hyperlipidemia type -offered retesting fasting, opted to hold off for now in light of COVID19 pandemic -reeval in 3 months at follow up with labs then if desired -advised med diet  and at least 150 minutes of aerobic exercise per week    I discussed the assessment and treatment plan with the patient. The patient was provided an opportunity to ask questions and all were answered. The patient agreed with the plan and demonstrated an understanding of the instructions.   The patient was advised to call back or seek an in-person evaluation if the symptoms  worsen or if the condition fails to improve as anticipated.   Follow up instructions: Advised assistant Wendie Simmer to help patient arrange the following: -TOC with Dr. Ethlyn Gallery in 3-4 months -PHQ9 in [place], DO

## 2018-09-22 ENCOUNTER — Other Ambulatory Visit: Payer: Self-pay | Admitting: Family Medicine

## 2018-09-29 ENCOUNTER — Other Ambulatory Visit: Payer: Self-pay

## 2018-09-29 ENCOUNTER — Encounter: Payer: Self-pay | Admitting: Family Medicine

## 2018-09-29 ENCOUNTER — Ambulatory Visit (INDEPENDENT_AMBULATORY_CARE_PROVIDER_SITE_OTHER): Payer: PRIVATE HEALTH INSURANCE | Admitting: Family Medicine

## 2018-09-29 DIAGNOSIS — E785 Hyperlipidemia, unspecified: Secondary | ICD-10-CM

## 2018-09-29 DIAGNOSIS — J302 Other seasonal allergic rhinitis: Secondary | ICD-10-CM

## 2018-09-29 DIAGNOSIS — F321 Major depressive disorder, single episode, moderate: Secondary | ICD-10-CM | POA: Diagnosis not present

## 2018-09-29 DIAGNOSIS — E559 Vitamin D deficiency, unspecified: Secondary | ICD-10-CM

## 2018-09-29 MED ORDER — BUPROPION HCL ER (XL) 150 MG PO TB24: 150.0000 mg | ORAL_TABLET | Freq: Every day | ORAL | 1 refills | Status: DC

## 2018-09-29 NOTE — Progress Notes (Signed)
Virtual Visit via Video Note  I connected with Bethany Oliver   on 09/29/18 at  2:30 PM EDT by a video enabled telemedicine application and verified that I am speaking with the correct person using two identifiers.  Location patient: home Location provider:work office Persons participating in the virtual visit: patient, provider  I discussed the limitations of evaluation and management by telemedicine and the availability of in person appointments. The patient expressed understanding and agreed to proceed.   Bethany Oliver DOB: [birth year] Encounter date: 09/29/2018  This is a 47 y.o. female who presents to establish care. No chief complaint on file.  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   Depression: on wellbutrin. Doing well with mood overall.   Seasonal allergies: on zyrtec. Have been good lately.   HLD: lifestyle changes, cutting back red meat with cholesterol improvement last check 04/2017  Cold sores - orc prn tx  Sees Dr. Dellis Filbert for gyn needs. Periods are regular.   More headaches she has noted with menstrual cycle. Takes ibuprofen or increases caffeine which usually works. At end of cycle. Just a day or two.   Past Medical History:  Diagnosis Date  . Allergy   . Depression    mild, no hospitalization  . Dry eyes    Past Surgical History:  Procedure Laterality Date  . IVF  2008-20012  . LASIK  05/2011  . LEEP  1999   No Known Allergies No outpatient medications have been marked as taking for the 09/29/18 encounter (Office Visit) with Caren Macadam, MD.   Social History   Tobacco Use  . Smoking status: Never Smoker  . Smokeless tobacco: Never Used  Substance Use Topics  . Alcohol use: Yes    Comment: 2 drinks a week    Family History  Problem Relation Age of Onset  . Hypertension Mother   . Parkinson's disease Father   . Hyperlipidemia Father   . Mental illness  Father   . Heart disease Maternal Grandmother   . Asthma Maternal Grandmother   . Diabetes Maternal Grandfather   . Heart disease Maternal Grandfather   . Anxiety disorder Sister   . Bipolar disorder Brother   . Heart disease Paternal Grandfather      Review of Systems  Constitutional: Negative for chills, fatigue and fever.  Respiratory: Negative for cough, chest tightness, shortness of breath and wheezing.   Cardiovascular: Negative for chest pain, palpitations and leg swelling.  Psychiatric/Behavioral: The patient is not nervous/anxious.     Objective:  There were no vitals taken for this visit.      BP Readings from Last 3 Encounters:  05/10/18 132/82  05/05/17 118/78  05/02/17 110/70   Wt Readings from Last 3 Encounters:  05/10/18 130 lb (59 kg)  05/05/17 131 lb 6.4 oz (59.6 kg)  05/02/17 128 lb 3.2 oz (58.2 kg)    EXAM:  GENERAL: alert, oriented, appears well and in no acute distress  HEENT: atraumatic, conjunctiva clear, no obvious abnormalities on inspection of external nose and ears  NECK: normal movements of the head and neck  LUNGS: on inspection no signs of respiratory distress, breathing rate appears normal, no obvious gross SOB, gasping or wheezing  CV: no obvious cyanosis  MS: moves all visible extremities without noticeable abnormality  PSYCH/NEURO: pleasant and cooperative, no obvious depression or anxiety, speech and thought processing grossly intact  SKIN: no facial  or neck abnormalities appreciated.   Assessment/Plan  1. Depression, major, single episode, moderate ([place]) Well controlled on wellbutrin. Continue current medication.  2. Seasonal allergic rhinitis, unspecified trigger Stable. - CBC with Differential/Platelet; Future  3. Vitamin D deficiency - VITAMIN D 25 Hydroxy (Vit-D Deficiency, Fractures); Future  4. Hyperlipidemia, unspecified hyperlipidemia type Diet controlled.  - Comprehensive metabolic panel; Future - Lipid  panel; Future - TSH; Future   Return pending bloodwork.   I discussed the assessment and treatment plan with the patient. The patient was provided an opportunity to ask questions and all were answered. The patient agreed with the plan and demonstrated an understanding of the instructions.   The patient was advised to call back or seek an in-person evaluation if the symptoms worsen or if the condition fails to improve as anticipated.  I provided 25 minutes of non-face-to-face time during this encounter.   Micheline Rough, MD

## 2018-10-02 ENCOUNTER — Telehealth: Payer: Self-pay | Admitting: *Deleted

## 2018-10-02 NOTE — Telephone Encounter (Signed)
-----   Message from Caren Macadam, MD sent at 09/29/2018  3:11 PM EDT ----- Please set up lab visit for her

## 2018-10-02 NOTE — Telephone Encounter (Signed)
I left a detailed message at the pts cell number to call back for an appt as below.

## 2018-10-06 ENCOUNTER — Other Ambulatory Visit: Payer: PRIVATE HEALTH INSURANCE

## 2018-10-24 ENCOUNTER — Other Ambulatory Visit: Payer: Self-pay | Admitting: Family Medicine

## 2018-10-26 ENCOUNTER — Encounter: Payer: Self-pay | Admitting: Family Medicine

## 2018-10-31 ENCOUNTER — Other Ambulatory Visit (INDEPENDENT_AMBULATORY_CARE_PROVIDER_SITE_OTHER): Payer: PRIVATE HEALTH INSURANCE

## 2018-10-31 ENCOUNTER — Other Ambulatory Visit: Payer: Self-pay

## 2018-10-31 DIAGNOSIS — E559 Vitamin D deficiency, unspecified: Secondary | ICD-10-CM | POA: Diagnosis not present

## 2018-10-31 DIAGNOSIS — J302 Other seasonal allergic rhinitis: Secondary | ICD-10-CM | POA: Diagnosis not present

## 2018-10-31 DIAGNOSIS — E785 Hyperlipidemia, unspecified: Secondary | ICD-10-CM

## 2018-10-31 LAB — COMPREHENSIVE METABOLIC PANEL
ALT: 9 U/L (ref 0–35)
AST: 12 U/L (ref 0–37)
Albumin: 4.1 g/dL (ref 3.5–5.2)
Alkaline Phosphatase: 40 U/L (ref 39–117)
BUN: 15 mg/dL (ref 6–23)
CO2: 28 mEq/L (ref 19–32)
Calcium: 9 mg/dL (ref 8.4–10.5)
Chloride: 106 mEq/L (ref 96–112)
Creatinine, Ser: 0.89 mg/dL (ref 0.40–1.20)
GFR: 68.09 mL/min (ref >60.00)
Glucose, Bld: 90 mg/dL (ref 70–99)
Potassium: 4.4 mEq/L (ref 3.5–5.1)
Sodium: 140 mEq/L (ref 135–145)
Total Bilirubin: 0.4 mg/dL (ref 0.2–1.2)
Total Protein: 6.1 g/dL (ref 6.0–8.3)

## 2018-10-31 LAB — CBC WITH DIFFERENTIAL/PLATELET
Basophils Absolute: 0.1 10*3/uL (ref 0.0–0.1)
Basophils Relative: 1 % (ref 0.0–3.0)
Eosinophils Absolute: 0.2 10*3/uL (ref 0.0–0.7)
Eosinophils Relative: 3.8 % (ref 0.0–5.0)
HCT: 37.4 % (ref 36.0–46.0)
Hemoglobin: 12.5 g/dL (ref 12.0–15.0)
Lymphocytes Relative: 24 % (ref 12.0–46.0)
Lymphs Abs: 1.4 10*3/uL (ref 0.7–4.0)
MCHC: 33.4 g/dL (ref 30.0–36.0)
MCV: 91.4 fl (ref 78.0–100.0)
Monocytes Absolute: 0.5 10*3/uL (ref 0.1–1.0)
Monocytes Relative: 7.8 % (ref 3.0–12.0)
Neutro Abs: 3.7 10*3/uL (ref 1.4–7.7)
Neutrophils Relative %: 63.4 % (ref 43.0–77.0)
Platelets: 213 10*3/uL (ref 150.0–400.0)
RBC: 4.09 Mil/uL (ref 3.87–5.11)
RDW: 12.5 % (ref 11.5–15.5)
WBC: 5.8 10*3/uL (ref 4.0–10.5)

## 2018-10-31 LAB — LIPID PANEL
Cholesterol: 195 mg/dL (ref 0–200)
HDL: 59.2 mg/dL (ref >39.00)
LDL Cholesterol: 120 mg/dL — ABNORMAL HIGH (ref 0–99)
NonHDL: 135.62
Total CHOL/HDL Ratio: 3
Triglycerides: 76 mg/dL (ref 0.0–149.0)
VLDL: 15.2 mg/dL (ref 0.0–40.0)

## 2018-10-31 LAB — TSH: TSH: 3.03 u[IU]/mL (ref 0.35–4.50)

## 2018-10-31 LAB — VITAMIN D 25 HYDROXY (VIT D DEFICIENCY, FRACTURES): VITD: 29.88 ng/mL — ABNORMAL LOW (ref 30.00–100.00)

## 2018-11-06 ENCOUNTER — Encounter: Payer: Self-pay | Admitting: Family Medicine

## 2019-04-09 ENCOUNTER — Other Ambulatory Visit: Payer: Self-pay | Admitting: Obstetrics & Gynecology

## 2019-04-09 DIAGNOSIS — N63 Unspecified lump in unspecified breast: Secondary | ICD-10-CM

## 2019-05-15 ENCOUNTER — Other Ambulatory Visit: Payer: Self-pay

## 2019-05-15 ENCOUNTER — Ambulatory Visit
Admission: RE | Admit: 2019-05-15 | Discharge: 2019-05-15 | Disposition: A | Discharge status: Home / Self Care | Payer: PRIVATE HEALTH INSURANCE | Source: Ambulatory Visit | Attending: Obstetrics & Gynecology | Admitting: Obstetrics & Gynecology

## 2019-05-15 DIAGNOSIS — N63 Unspecified lump in unspecified breast: Secondary | ICD-10-CM

## 2019-06-04 ENCOUNTER — Encounter: Payer: PRIVATE HEALTH INSURANCE | Admitting: Obstetrics & Gynecology

## 2019-06-12 ENCOUNTER — Encounter: Payer: PRIVATE HEALTH INSURANCE | Admitting: Obstetrics & Gynecology

## 2019-06-13 ENCOUNTER — Ambulatory Visit (INDEPENDENT_AMBULATORY_CARE_PROVIDER_SITE_OTHER): Payer: PRIVATE HEALTH INSURANCE | Admitting: Obstetrics & Gynecology

## 2019-06-13 ENCOUNTER — Encounter: Payer: Self-pay | Admitting: Obstetrics & Gynecology

## 2019-06-13 ENCOUNTER — Other Ambulatory Visit: Payer: Self-pay

## 2019-06-13 VITALS — BP 116/70 | Ht 63.0 in | Wt 126.0 lb

## 2019-06-13 DIAGNOSIS — Z01419 Encounter for gynecological examination (general) (routine) without abnormal findings: Secondary | ICD-10-CM

## 2019-06-13 DIAGNOSIS — Z308 Encounter for other contraceptive management: Secondary | ICD-10-CM | POA: Diagnosis not present

## 2019-06-13 NOTE — Patient Instructions (Signed)
1. Well female exam with routine gynecological exam Normal gynecologic exam.  Pap test January 2019 was negative with negative high-risk HPV, will repeat Pap test next year.  Breast exam normal.  Stable small right fibroadenoma.  Had right diagnostic mammogram and ultrasound every 6 months but now will resume annually with screening mammogram.  Health labs with family physician.  Good body mass index of 22.32.  Good fitness and nutrition.  2. Encounter for other contraceptive management H/O Primary infertility, declines contraception.  Other orders - metroNIDAZOLE-Cleanser 0.75 % (Cream) KIT; Apply topically. - triamcinolone (NASACORT ALLERGY 24HR) 55 MCG/ACT AERO nasal inhaler; Place 2 sprays into the nose daily.  Bethany Oliver, it was a pleasure seeing you today!

## 2019-06-13 NOTE — Progress Notes (Signed)
Bethany Oliver September 21, 1970 [id number]   History:    49 y.o. 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  RP:  Established patient presenting for annual gyn exam   HPI: Menstrual periods every month with normal flow.  No breakthrough bleeding.  No pelvic pain.  Reports increased headaches post menses, controlled with ibuprofen.  No pain with intercourse.  Urine and bowel movements normal.  Breast normal.  Stable small right breast fibroadenoma, not felt by patient.  Body mass index 22.32.  Running on a regular basis.  Healthy nutrition.  Health labs with family physician.   Past medical history,surgical history, family history and social history were all reviewed and documented in the EPIC chart.  Gynecologic History Patient's last menstrual period was 05/29/2019.  Obstetric History OB History  Gravida Para Term Preterm AB Living  1 0     1 0  SAB TAB Ectopic Multiple Live Births  1            # Outcome Date GA Lbr Len/2nd Weight Sex Delivery Anes PTL Lv  1 SAB              ROS: A ROS was performed and pertinent positives and negatives are included in the history.  GENERAL: No fevers or chills. HEENT: No change in vision, no earache, sore throat or sinus congestion. NECK: No pain or stiffness. CARDIOVASCULAR: No chest pain or pressure. No palpitations. PULMONARY: No shortness of breath, cough or wheeze. GASTROINTESTINAL: No abdominal pain, nausea, vomiting or diarrhea, melena or bright red blood per rectum. GENITOURINARY: No urinary frequency, urgency, hesitancy or dysuria. MUSCULOSKELETAL: No joint or muscle pain, no back pain, no recent trauma. DERMATOLOGIC: No rash, no itching, no lesions. ENDOCRINE: No polyuria, polydipsia, no heat or cold intolerance. No recent change in weight. HEMATOLOGICAL: No anemia or easy bruising or bleeding. NEUROLOGIC: No headache, seizures, numbness, tingling or weakness. PSYCHIATRIC: No depression, no loss  of interest in normal activity or change in sleep pattern.     Exam:   BP 116/70   Ht '5\' 3"'$  (1.6 m)   Wt 126 lb (57.2 kg)   LMP 05/29/2019   BMI 22.32 kg/m   Body mass index is 22.32 kg/m.  General appearance : Well developed well nourished female. No acute distress HEENT: Eyes: no retinal hemorrhage or exudates,  Neck supple, trachea midline, no carotid bruits, no thyroidmegaly Lungs: Clear to auscultation, no rhonchi or wheezes, or rib retractions  Heart: Regular rate and rhythm, no murmurs or gallops Breast:Examined in sitting and supine position were symmetrical in appearance, no palpable masses or tenderness,  no skin retraction, no nipple inversion, no nipple discharge, no skin discoloration, no axillary or supraclavicular lymphadenopathy Abdomen: no palpable masses or tenderness, no rebound or guarding Extremities: no edema or skin discoloration or tenderness  Pelvic: Vulva: Normal             Vagina: No gross lesions or discharge  Cervix: No gross lesions or discharge  Uterus  AV, normal size, shape and consistency, non-tender and mobile  Adnexa  Without masses or tenderness  Anus: Normal   Assessment/Plan:  49 y.o. female for annual exam   1. Well female exam with routine gynecological exam Normal gynecologic exam.  Pap test January 2019 was negative with negative high-risk HPV, will repeat Pap test next year.  Breast exam normal.  Stable small right fibroadenoma.  Had right diagnostic mammogram and ultrasound every 6 months  but now will resume annually with screening mammogram.  Health labs with family physician.  Good body mass index of 22.32.  Good fitness and nutrition.  2. Encounter for other contraceptive management H/O Primary infertility, declines contraception.  Other orders - metroNIDAZOLE-Cleanser 0.75 % (Cream) KIT; Apply topically. - triamcinolone (NASACORT ALLERGY 24HR) 55 MCG/ACT AERO nasal inhaler; Place 2 sprays into the nose daily.  Princess Bruins MD, 12:52 PM 06/13/2019

## 2019-09-25 ENCOUNTER — Other Ambulatory Visit: Payer: Self-pay

## 2019-09-25 ENCOUNTER — Ambulatory Visit (INDEPENDENT_AMBULATORY_CARE_PROVIDER_SITE_OTHER): Payer: PRIVATE HEALTH INSURANCE | Admitting: Obstetrics & Gynecology

## 2019-09-25 ENCOUNTER — Encounter: Payer: Self-pay | Admitting: Obstetrics & Gynecology

## 2019-09-25 VITALS — BP 120/78

## 2019-09-25 DIAGNOSIS — N921 Excessive and frequent menstruation with irregular cycle: Secondary | ICD-10-CM

## 2019-09-25 DIAGNOSIS — N951 Menopausal and female climacteric states: Secondary | ICD-10-CM | POA: Diagnosis not present

## 2019-09-25 DIAGNOSIS — R102 Pelvic and perineal pain: Secondary | ICD-10-CM

## 2019-09-25 LAB — CBC
HCT: 36.5 % (ref 35.0–45.0)
Hemoglobin: 12.2 g/dL (ref 11.7–15.5)
MCH: 30.6 pg (ref 27.0–33.0)
MCHC: 33.4 g/dL (ref 32.0–36.0)
MCV: 91.5 fL (ref 80.0–100.0)
MPV: 11.8 fL (ref 7.5–12.5)
Platelets: 212 10*3/uL (ref 140–400)
RBC: 3.99 10*6/uL (ref 3.80–5.10)
RDW: 12 % (ref 11.0–15.0)
WBC: 5.1 10*3/uL (ref 3.8–10.8)

## 2019-09-25 LAB — TSH: TSH: 2.05 mIU/L

## 2019-09-25 NOTE — Progress Notes (Addendum)
    Bethany Oliver 1970/11/29 [id number]        49 y.o.  G1P0010   RP: Irregular menses x 3 months  HPI:  Menses lasting up to 10 days with some BTB x last 3 months.  No pelvic pain. No abnormal vaginal d/c. Increased pelvic cramping and bloating with H/As worsened in the premenstrual period. No UTI Sx.  BMs normal.  No fever.   OB History  Gravida Para Term Preterm AB Living  1 0     1 0  SAB TAB Ectopic Multiple Live Births  1            # Outcome Date GA Lbr Len/2nd Weight Sex Delivery Anes PTL Lv  1 SAB             Past medical history,surgical history, problem list, medications, allergies, family history and social history were all reviewed and documented in the EPIC chart.   Directed ROS with pertinent positives and negatives documented in the history of present illness/assessment and plan.  Exam:  Vitals:   09/25/19 1008  BP: 120/78   General appearance:  Normal  Abdomen: Normal  Gynecologic exam: Vulva normal.  Bimanual exam:  Uterus AV, normal volume, mobile, NT.  Cervix normal, no Polyp.  No adnexal mass/NT.    U/A Neg UPT Neg   Assessment/Plan:  49 y.o. G1P0010   1. Metrorrhagia Metrorrhagia for the last 3 months.  Rule out hormonal causes such as hypothyroidism.  Possible perimenopause.  Rule out anemia.  Rule out pregnancy.  We will follow-up for a pelvic ultrasound to evaluate the endometrial lining.  Rule out endometrial polyp, submucosal fibroid, endometrial hyperplasia and endometrial cancer. - TSH - CBC - US Transvaginal Non-OB; Future - Pregnancy, urine  2. Pelvic cramping Urine analysis negative.  We will follow-up for pelvic ultrasound to rule out gynecologic pathology. - US Transvaginal Non-OB; Future - Urinalysis,Complete w/RFL Culture  3. Perimenopause Probable perimenopause with the beginning of oligoovulation and increased PMS.  Will investigate first and then decide if any progestin hormonal therapy is indicated.  Princess Bruins  MD, 10:16 AM 09/25/2019

## 2019-09-26 LAB — NO CULTURE INDICATED

## 2019-09-26 LAB — URINALYSIS, COMPLETE W/RFL CULTURE
Bacteria, UA: NONE SEEN /HPF
Bilirubin Urine: NEGATIVE
Glucose, UA: NEGATIVE
Hgb urine dipstick: NEGATIVE
Hyaline Cast: NONE SEEN /LPF
Ketones, ur: NEGATIVE
Leukocyte Esterase: NEGATIVE
Nitrites, Initial: NEGATIVE
Protein, ur: NEGATIVE
RBC / HPF: NONE SEEN /HPF (ref 0–2)
Specific Gravity, Urine: 1.01 (ref 1.001–1.03)
WBC, UA: NONE SEEN /HPF (ref 0–5)
pH: 7 (ref 5.0–8.0)

## 2019-09-26 LAB — PREGNANCY, URINE: Preg Test, Ur: NEGATIVE

## 2019-09-27 ENCOUNTER — Encounter: Payer: Self-pay | Admitting: *Deleted

## 2019-09-27 ENCOUNTER — Encounter: Payer: Self-pay | Admitting: Obstetrics & Gynecology

## 2019-10-11 ENCOUNTER — Ambulatory Visit (INDEPENDENT_AMBULATORY_CARE_PROVIDER_SITE_OTHER): Payer: PRIVATE HEALTH INSURANCE

## 2019-10-11 ENCOUNTER — Encounter: Payer: Self-pay | Admitting: Obstetrics & Gynecology

## 2019-10-11 ENCOUNTER — Ambulatory Visit (INDEPENDENT_AMBULATORY_CARE_PROVIDER_SITE_OTHER): Payer: PRIVATE HEALTH INSURANCE | Admitting: Obstetrics & Gynecology

## 2019-10-11 ENCOUNTER — Other Ambulatory Visit: Payer: Self-pay

## 2019-10-11 DIAGNOSIS — N921 Excessive and frequent menstruation with irregular cycle: Secondary | ICD-10-CM

## 2019-10-11 DIAGNOSIS — R102 Pelvic and perineal pain: Secondary | ICD-10-CM

## 2019-10-11 DIAGNOSIS — N83201 Unspecified ovarian cyst, right side: Secondary | ICD-10-CM

## 2019-10-11 DIAGNOSIS — D251 Intramural leiomyoma of uterus: Secondary | ICD-10-CM | POA: Diagnosis not present

## 2019-10-11 NOTE — Progress Notes (Signed)
    JEANNELLE WIENS 1970-11-29 [id number]        49 y.o.  G1P0010   RP: Menometrorrhagia for Pelvic US  HPI: Last menses were normal with no BTB.  At last visit we noted: Menses lasting up to 10 days with some BTB x last 3 months.  No pelvic pain. No abnormal vaginal d/c. Increased pelvic cramping and bloating with H/As worsened in the premenstrual period. No UTI Sx.  BMs normal.  No fever.    OB History  Gravida Para Term Preterm AB Living  1 0     1 0  SAB TAB Ectopic Multiple Live Births  1            # Outcome Date GA Lbr Len/2nd Weight Sex Delivery Anes PTL Lv  1 SAB             Past medical history,surgical history, problem list, medications, allergies, family history and social history were all reviewed and documented in the EPIC chart.   Directed ROS with pertinent positives and negatives documented in the history of present illness/assessment and plan.  Exam:  There were no vitals filed for this visit. General appearance:  Normal  Pelvic US today: T/V images.  Anteverted uterus slightly enlarged with 2 fibroids measured at 2.31 cm and 2.47 cm.  The overall uterine size is measured at 8.35 x 5.92 x 3.84 cm.  The endometrial lining is symmetrical and normal at 6.4 mm.  No endometrial mass or thickening seen.  The right ovary has a thin-walled echo-free cyst measured at 3.19 x 3.19 cm.  The cyst is avascular with smooth margins.  The left ovary was normal with no follicles visualized.  No adnexal mass.  No free fluid in the posterior cul-de-sac.   Assessment/Plan:  49 y.o. G1P0010   1. Metrorrhagia last menstrual period was normal.  Pelvic ultrasound findings thoroughly reviewed with patient today. Uterus shows a normal endometrial lining with no evidence of polyps, submucosal myoma, endometrial hyperplasia or endometrial cancer.  She has 2 small intramural fibroids which are currently asymptomatic.  Her right ovary shows a small functional cyst of ovulation.  Patient  reassured about all the findings.  The progestin pill was discussed.  Patient prefers observation at this time.  Precautions discussed.  Princess Bruins MD, 12:30 PM 10/11/2019

## 2019-10-24 ENCOUNTER — Encounter: Payer: PRIVATE HEALTH INSURANCE | Admitting: Family Medicine

## 2019-10-25 ENCOUNTER — Other Ambulatory Visit: Payer: Self-pay | Admitting: *Deleted

## 2019-10-25 MED ORDER — BUPROPION HCL ER (XL) 150 MG PO TB24: 150.0000 mg | ORAL_TABLET | Freq: Every day | ORAL | 5 refills | Status: DC

## 2019-10-25 NOTE — Telephone Encounter (Signed)
Rx done. 

## 2019-10-26 ENCOUNTER — Other Ambulatory Visit: Payer: Self-pay

## 2019-10-26 ENCOUNTER — Ambulatory Visit: Payer: PRIVATE HEALTH INSURANCE | Admitting: Family Medicine

## 2019-10-26 ENCOUNTER — Encounter: Payer: Self-pay | Admitting: Family Medicine

## 2019-10-26 ENCOUNTER — Other Ambulatory Visit: Payer: Self-pay | Admitting: Family Medicine

## 2019-10-26 VITALS — BP 94/72 | HR 50 | Temp 98.2°F | Ht 63.75 in | Wt 133.2 lb

## 2019-10-26 DIAGNOSIS — J302 Other seasonal allergic rhinitis: Secondary | ICD-10-CM | POA: Insufficient documentation

## 2019-10-26 DIAGNOSIS — N920 Excessive and frequent menstruation with regular cycle: Secondary | ICD-10-CM | POA: Insufficient documentation

## 2019-10-26 DIAGNOSIS — F339 Major depressive disorder, recurrent, unspecified: Secondary | ICD-10-CM

## 2019-10-26 DIAGNOSIS — E559 Vitamin D deficiency, unspecified: Secondary | ICD-10-CM

## 2019-10-26 DIAGNOSIS — Z0000 Encounter for general adult medical examination without abnormal findings: Secondary | ICD-10-CM

## 2019-10-26 DIAGNOSIS — E785 Hyperlipidemia, unspecified: Secondary | ICD-10-CM

## 2019-10-26 DIAGNOSIS — D649 Anemia, unspecified: Secondary | ICD-10-CM

## 2019-10-26 DIAGNOSIS — L719 Rosacea, unspecified: Secondary | ICD-10-CM | POA: Insufficient documentation

## 2019-10-26 MED ORDER — BUPROPION HCL ER (XL) 150 MG PO TB24
150.0000 mg | ORAL_TABLET | Freq: Every day | ORAL | 3 refills | Status: DC
Start: 2019-10-26 — End: 2019-10-30

## 2019-10-26 NOTE — Patient Instructions (Addendum)
Can follow up with Dr. Micheline Chapman or Charlann Boxer for low back.   Cathy Younitis yoga Frederic

## 2019-10-26 NOTE — Progress Notes (Signed)
Bethany Oliver DOB: 07/11/1970 Encounter date: 10/26/2019  This is a 49 y.o. female who presents for complete physical   History of present illness/Additional concerns: Last visit with me was August/2020 and was a virtual visit for establishing care.  Depression: Wellbutrin. Mood has been good. Seasonal allergies: Zyrtec- allergies have been good. Adds nasacort when allergies bad.   Has cut out meat for the most part, eating pretty healthy.   Diagnosed with rosacea. Following with derm - [facility].    Follows with Dr. Dellis Filbert for gyn needs and recently has been following for menorrhagia.   Past Medical History:  Diagnosis Date   Allergy    Depression    mild, no hospitalization   Dry eyes    Past Surgical History:  Procedure Laterality Date   IVF  2008-20012   LASIK  05/2011   LEEP  1999   No Known Allergies Current Meds  Medication Sig   buPROPion (WELLBUTRIN XL) 150 MG 24 hr tablet Take 1 tablet (150 mg total) by mouth daily.   cetirizine (ZYRTEC) 10 MG tablet Take 10 mg by mouth daily.   metronidazole (NORITATE) 1 % cream Apply topically as needed. roseacea   Omega-3 Fatty Acids (FISH OIL PO) Take by mouth.   RESTASIS 0.05 % ophthalmic emulsion    Tretinoin (RETIN-A EX) Apply topically at bedtime.   triamcinolone (NASACORT ALLERGY 24HR) 55 MCG/ACT AERO nasal inhaler Place 2 sprays into the nose as needed.    Social History   Tobacco Use   Smoking status: Never Smoker   Smokeless tobacco: Never Used  Substance Use Topics   Alcohol use: Yes    Comment: 2 drinks a week    Family History  Problem Relation Age of Onset   Hypertension Mother    Parkinson's disease Father    Hyperlipidemia Father    Mental illness Father    Heart disease Maternal Grandmother    Asthma Maternal Grandmother    Diabetes Maternal Grandfather    Heart disease Maternal Grandfather    Anxiety disorder Sister    Bipolar disorder Brother    Heart disease  Paternal Grandfather      Review of Systems  Constitutional: Negative for activity change, appetite change, chills, fatigue, fever and unexpected weight change.  HENT: Negative for congestion, ear pain, hearing loss, sinus pressure, sinus pain, sore throat and trouble swallowing.   Eyes: Negative for pain and visual disturbance.  Respiratory: Negative for cough, chest tightness, shortness of breath and wheezing.   Cardiovascular: Negative for chest pain, palpitations and leg swelling.  Gastrointestinal: Negative for abdominal pain, blood in stool, constipation, diarrhea, nausea and vomiting.  Genitourinary: Negative for difficulty urinating and menstrual problem.  Musculoskeletal: Negative for arthralgias and back pain.  Skin: Negative for rash.  Neurological: Negative for dizziness, weakness, numbness and headaches.  Hematological: Negative for adenopathy. Does not bruise/bleed easily.  Psychiatric/Behavioral: Negative for sleep disturbance and suicidal ideas. The patient is not nervous/anxious.     CBC:  Lab Results  Component Value Date   WBC 5.1 09/25/2019   HGB 12.2 09/25/2019   HCT 36.5 09/25/2019   MCH 30.6 09/25/2019   MCHC 33.4 09/25/2019   RDW 12.0 09/25/2019   PLT 212 09/25/2019   MPV 11.8 09/25/2019   CMP: Lab Results  Component Value Date   NA 140 10/31/2018   K 4.4 10/31/2018   CL 106 10/31/2018   CO2 28 10/31/2018   GLUCOSE 90 10/31/2018   BUN 15 10/31/2018  CREATININE 0.89 10/31/2018   CALCIUM 9.0 10/31/2018   PROT 6.1 10/31/2018   BILITOT 0.4 10/31/2018   ALKPHOS 40 10/31/2018   ALT 9 10/31/2018   AST 12 10/31/2018   LIPID: Lab Results  Component Value Date   CHOL 195 10/31/2018   TRIG 76.0 10/31/2018   HDL 59.20 10/31/2018   LDLCALC 120 (H) 10/31/2018    Objective:  BP 94/72 (BP Location: Left Arm, Patient Position: Sitting, Cuff Size: Normal)    Pulse (!) 50    Temp 98.2 F (36.8 C) (Oral)    Ht 5' 3.75" (1.619 m)    Wt 133 lb 3.2 oz  (60.4 kg)    LMP 10/03/2019 (Exact Date)    BMI 23.04 kg/m   Weight: 133 lb 3.2 oz (60.4 kg)   BP Readings from Last 3 Encounters:  10/26/19 94/72  09/25/19 120/78  06/13/19 116/70   Wt Readings from Last 3 Encounters:  10/26/19 133 lb 3.2 oz (60.4 kg)  06/13/19 126 lb (57.2 kg)  05/10/18 130 lb (59 kg)    Physical Exam Constitutional:      General: She is not in acute distress.    Appearance: She is well-developed.  HENT:     Head: Normocephalic and atraumatic.     Right Ear: External ear normal.     Left Ear: External ear normal.     Mouth/Throat:     Pharynx: No oropharyngeal exudate.  Eyes:     Conjunctiva/sclera: Conjunctivae normal.     Pupils: Pupils are equal, round, and reactive to light.  Neck:     Thyroid: No thyromegaly.  Cardiovascular:     Rate and Rhythm: Normal rate and regular rhythm.     Heart sounds: Normal heart sounds. No murmur heard.  No friction rub. No gallop.   Pulmonary:     Effort: Pulmonary effort is normal.     Breath sounds: Normal breath sounds.  Abdominal:     General: Bowel sounds are normal. There is no distension.     Palpations: Abdomen is soft. There is no mass.     Tenderness: There is no abdominal tenderness. There is no guarding.     Hernia: No hernia is present.  Musculoskeletal:        General: No tenderness or deformity. Normal range of motion.     Cervical back: Normal range of motion and neck supple.  Lymphadenopathy:     Cervical: No cervical adenopathy.  Skin:    General: Skin is warm and dry.     Findings: No rash.  Neurological:     Mental Status: She is alert and oriented to person, place, and time.     Deep Tendon Reflexes: Reflexes normal.     Reflex Scores:      Tricep reflexes are 2+ on the right side and 2+ on the left side.      Bicep reflexes are 2+ on the right side and 2+ on the left side.      Brachioradialis reflexes are 2+ on the right side and 2+ on the left side.      Patellar reflexes are 2+ on  the right side and 2+ on the left side. Psychiatric:        Speech: Speech normal.        Behavior: Behavior normal.        Thought Content: Thought content normal.     Assessment/Plan: Health Maintenance Due  Topic Date Due   Hepatitis C Screening  Never done   INFLUENZA VACCINE  09/23/2019   Health Maintenance reviewed.  1. Preventative health care Keep up with healthy eating and exercise.  - Hepatitis C antibody; Future  2. Seasonal allergies Doing well with zyrtec.   3. Menorrhagia with regular cycle Following with gyn; cycle has improved.   4. Depression, recurrent ([place]) Stable; continue wellbutrin.   5. Rosacea Topical flagyl  6. Anemia, unspecified type - CBC with Differential/Platelet; Future - Vitamin B12; Future - Iron, TIBC and Ferritin Panel; Future  7. Hyperlipidemia, unspecified hyperlipidemia type Has been diet controlled.  - Comprehensive metabolic panel; Future - Lipid panel; Future  8. Vitamin D deficiency - VITAMIN D 25 Hydroxy (Vit-D Deficiency, Fractures); Future    Return in about 1 year (around 10/25/2020) for physical exam.  Micheline Rough, MD

## 2019-10-30 ENCOUNTER — Encounter: Payer: Self-pay | Admitting: Family Medicine

## 2019-10-30 LAB — VITAMIN D 25 HYDROXY (VIT D DEFICIENCY, FRACTURES): Vit D, 25-Hydroxy: 25 ng/mL — ABNORMAL LOW (ref 30–100)

## 2019-10-30 LAB — IRON,TIBC AND FERRITIN PANEL
%SAT: 16 % (calc) (ref 16–45)
Ferritin: 16 ng/mL (ref 16–232)
Iron: 58 ug/dL (ref 40–190)
TIBC: 373 mcg/dL (calc) (ref 250–450)

## 2019-10-30 LAB — LIPID PANEL
Cholesterol: 208 mg/dL — ABNORMAL HIGH (ref <200)
HDL: 71 mg/dL (ref >=50)
LDL Cholesterol (Calc): 122 mg/dL (calc) — ABNORMAL HIGH
Non-HDL Cholesterol (Calc): 137 mg/dL (calc) — ABNORMAL HIGH (ref <130)
Total CHOL/HDL Ratio: 2.9 (calc) (ref <5.0)
Triglycerides: 63 mg/dL (ref <150)

## 2019-10-30 LAB — CBC WITH DIFFERENTIAL/PLATELET
Absolute Monocytes: 454 cells/uL (ref 200–950)
Basophils Absolute: 57 cells/uL (ref 0–200)
Basophils Relative: 0.8 %
Eosinophils Absolute: 149 cells/uL (ref 15–500)
Eosinophils Relative: 2.1 %
HCT: 36.3 % (ref 35.0–45.0)
Hemoglobin: 12.1 g/dL (ref 11.7–15.5)
Lymphs Abs: 1441 cells/uL (ref 850–3900)
MCH: 30.4 pg (ref 27.0–33.0)
MCHC: 33.3 g/dL (ref 32.0–36.0)
MCV: 91.2 fL (ref 80.0–100.0)
MPV: 11.7 fL (ref 7.5–12.5)
Monocytes Relative: 6.4 %
Neutro Abs: 4998 cells/uL (ref 1500–7800)
Neutrophils Relative %: 70.4 %
Platelets: 222 10*3/uL (ref 140–400)
RBC: 3.98 10*6/uL (ref 3.80–5.10)
RDW: 12 % (ref 11.0–15.0)
Total Lymphocyte: 20.3 %
WBC: 7.1 10*3/uL (ref 3.8–10.8)

## 2019-10-30 LAB — COMPREHENSIVE METABOLIC PANEL
AG Ratio: 1.8 (calc) (ref 1.0–2.5)
ALT: 9 U/L (ref 6–29)
AST: 12 U/L (ref 10–35)
Albumin: 4 g/dL (ref 3.6–5.1)
Alkaline phosphatase (APISO): 43 U/L (ref 31–125)
BUN: 12 mg/dL (ref 7–25)
CO2: 26 mmol/L (ref 20–32)
Calcium: 9.1 mg/dL (ref 8.6–10.2)
Chloride: 103 mmol/L (ref 98–110)
Creat: 0.88 mg/dL (ref 0.50–1.10)
Globulin: 2.2 g/dL (calc) (ref 1.9–3.7)
Glucose, Bld: 97 mg/dL (ref 65–99)
Potassium: 4.1 mmol/L (ref 3.5–5.3)
Sodium: 138 mmol/L (ref 135–146)
Total Bilirubin: 0.4 mg/dL (ref 0.2–1.2)
Total Protein: 6.2 g/dL (ref 6.1–8.1)

## 2019-10-30 LAB — HEPATITIS C ANTIBODY
Hepatitis C Ab: NONREACTIVE
SIGNAL TO CUT-OFF: 0.01 (ref <1.00)

## 2019-10-30 LAB — VITAMIN B12: Vitamin B-12: 250 pg/mL (ref 200–1100)

## 2019-10-30 MED ORDER — BUPROPION HCL ER (XL) 150 MG PO TB24
150.0000 mg | ORAL_TABLET | Freq: Every day | ORAL | 0 refills | Status: DC
Start: 2019-10-30 — End: 2020-10-24

## 2019-11-06 ENCOUNTER — Other Ambulatory Visit: Payer: Self-pay

## 2019-11-06 ENCOUNTER — Ambulatory Visit (INDEPENDENT_AMBULATORY_CARE_PROVIDER_SITE_OTHER): Payer: PRIVATE HEALTH INSURANCE | Admitting: Sports Medicine

## 2019-11-06 VITALS — BP 102/68 | Ht 64.0 in | Wt 130.0 lb

## 2019-11-06 DIAGNOSIS — M545 Low back pain, unspecified: Secondary | ICD-10-CM

## 2019-11-06 DIAGNOSIS — G8929 Other chronic pain: Secondary | ICD-10-CM | POA: Diagnosis not present

## 2019-11-06 DIAGNOSIS — Y93B9 Activity, other involving muscle strengthening exercises: Secondary | ICD-10-CM | POA: Diagnosis not present

## 2019-11-06 MED ORDER — TIZANIDINE HCL 2 MG PO CAPS: 2.0000 mg | ORAL_CAPSULE | Freq: Three times a day (TID) | ORAL | 0 refills | Status: DC | PRN

## 2019-11-06 NOTE — Progress Notes (Signed)
Office Visit Note   Patient: Bethany Oliver           Date of Birth: 1970/07/08           MRN: [id number] Visit Date: 11/06/2019 Requested by: Caren Macadam, MD [place],  [place] [address] PCP: Caren Macadam, MD  Subjective: CC: Left sided low back pain x8 months  HPI: 49 year old female presenting to clinic with subacute left-sided low back pain, which has been bothering her for the past 8 months.  Patient states that prior to the pandemic she was an avid runner, although once Covid hip she started to build a home gym for herself.  Included in his home gym with weights set, and several months ago she started squatting and dead lifting exercises.  She began to notice left lower back pain, which she initially felt was very mild and easy to ignore.  Approximately 3 months ago, however, her pain significantly worsened after set of squats-and began to radiate down the back of her left leg.  She states the radiation was not particularly painful, and felt like a "pins-and-needles" sensation.  This has since improved, though the pain in her back remains.  She has been scared to return to her home gym exercises, though she has returned to running with minimal pain.  Pain does not wake her up at night, she denies any weakness in the affected leg, no bowel or bladder dysfunction.               ROS:   All other systems were reviewed and are negative.  Objective: Vital Signs: BP 102/68   Ht 5\' 4"  (1.626 m)   Wt 130 lb (59 kg)   BMI 22.31 kg/m   Physical Exam:  General:  Alert and oriented, in no acute distress. Pulm:  Breathing unlabored. Psy:  Normal mood, congruent affect. Skin: No bruising or rashes appreciated. BACK EXAMINATION: Normal Gait.  Normal Spinal curvature, without excessive lumbar lordosis, thoracic kyphosis, or scoliosis.  ROM: No Pain with forward flexion or extension. Able to achieve Toe-Touch. Does endorse pain with rotation to the left as well  as side bending right.   Palpation: No midline tenderness, no deformity or step-offs. Mild paraspinal muscle tenderness- most significant over lower lumbar area (approx L4-L5 level), and with tenderness over the left SI Joint. Gluteus musculature and piriformis without tender points. Negative Sacral Spring's test.   Strength: Hip flexion (L1), Hip Aduction (L2), Knee Extension (L3) are 5/5 Bilaterally Foot Inversion (L4), Dorsiflexion (L5), and Eversion (S1) 5/5 Bilaterally *Left greater toe extension strength reduced to 4/5 on left, 5/5 on right.   Sensation: Intact to light touch medial and lateral aspects of lower extremities, and lateral, dorsal, and medial aspects of foot.  Reflexes: Patellar (L4), and Ankle (S1) 2+ Bilaterally Special Tests:  FABER: endorses mild SI Joint discomfort on left SLR: No radiation down Ipilateral or contralateral leg bilaterally  Imaging: No results found.  Assessment & Plan: 49 year old female presenting to clinic with subacute left lower back/SI joint pain. Symptoms initially with numbness in the L5 dermatome, though this has improved- however does have reduced strength with L greater toe extension (L5).  -Concern for L5 disc pathology, however it is reassuring that her symptoms have overall improved since her initial onset.  -Suspect she will be a good candidate for physical therapy for back rehabilitation exercises and stretches -Zanaflex PRN spasm -Discussed avoiding forward flexion activities in her home gym for  now (No squatting, deadlifts, etc). Should her pain completely resolve and she feels able to resume these, she should be very cautious about her correct posture and added weights.  -F/U if no improvement after 4-6 wks of physical therapy -Patient had no further questions or concerns today.    Patient seen and evaluated with the sports medicine fellow.  I agree with the above plan of care.  We will schedule a tentative appointment for 4 to 6  weeks from now.  If she improves with today's treatment she may feel free to cancel the follow-up appointment and follow-up as needed.

## 2019-11-09 ENCOUNTER — Other Ambulatory Visit: Payer: Self-pay

## 2019-11-09 ENCOUNTER — Ambulatory Visit: Payer: PRIVATE HEALTH INSURANCE | Attending: Sports Medicine | Admitting: Physical Therapy

## 2019-11-09 DIAGNOSIS — M545 Low back pain, unspecified: Secondary | ICD-10-CM

## 2019-11-09 DIAGNOSIS — M6281 Muscle weakness (generalized): Secondary | ICD-10-CM | POA: Insufficient documentation

## 2019-11-09 DIAGNOSIS — R252 Cramp and spasm: Secondary | ICD-10-CM | POA: Diagnosis not present

## 2019-11-09 DIAGNOSIS — G8929 Other chronic pain: Secondary | ICD-10-CM | POA: Diagnosis present

## 2019-11-09 NOTE — Therapy (Addendum)
[facility] [address] [place], [place], [address] Phone: [phone]   Fax:  [phone]  Physical Therapy Evaluation  Patient Details  Name: Bethany Oliver MRN: [id number]  Date of Birth: 22-May-1970 Referring Provider (PT): Thurman Coyer, DO   Encounter Date: 11/09/2019   PT End of Session - 11/09/19 1217    Visit Number 1    Date for PT Re-Evaluation 02/01/20    PT Start Time 0845    PT Stop Time 0927    PT Time Calculation (min) 42 min    Activity Tolerance Patient tolerated treatment well    Behavior During Therapy [facility] for tasks assessed/performed           Past Medical History:  Diagnosis Date  . Allergy   . Depression    mild, no hospitalization  . Dry eyes     Past Surgical History:  Procedure Laterality Date  . IVF  2008-20012  . LASIK  05/2011  . LEEP  1999    There were no vitals filed for this visit.    Subjective Assessment - 11/09/19 0849    Subjective I have been getting some back pain in the Left side and weakness in the left leg and it usually hurts when sitting. Going on for 10 months at least.  I feel like it is more pronounced the more I sit.  It doesn't hurt just pressure.  Walking and moving feels better.  I run 4 miles a couple times a week.  I think it began after doing squats with weights, but the last 3 months it started hurting more and not from lifting weights.    Limitations Sitting    Currently in Pain? Yes    Pain Score 2    5/10 at most   Pain Location Back    Pain Orientation Left;Lower    Pain Descriptors / Indicators Pressure    Pain Type Chronic pain    Pain Radiating Towards has been tingling down the leg L5 nerve pattern    Pain Onset More than a month ago    Pain Frequency Intermittent    Aggravating Factors  sitting a long time, first getting up in the morning    Pain Relieving Factors heat and moving    Multiple Pain Sites No              [facility]  PT Assessment - 11/11/19 0001      Assessment   Medical Diagnosis M54.5,G89.29 (ICD-10-CM) - Chronic left-sided low back pain, unspecified whether sciatica present    Referring Provider (PT) Thurman Coyer, DO    Onset Date/Surgical Date --   10+months ago   Prior Therapy No      Precautions   Precautions None      Restrictions   Weight Bearing Restrictions No      Balance Screen   Has the patient fallen in the past 6 months No      [place] residence    Living Arrangements Spouse/significant other;Children   daughter     Prior Function   Level of Independence Independent    Vocation Part time employment    Vocation Requirements mostly sitting; occasional standing all day which aggravates    Leisure running, HIIT classes      Cognition   Overall Cognitive Status Within Functional Limits for tasks assessed      Observation/Other Assessments   Focus on  Therapeutic Outcomes (FOTO)  17% (goal 14%)      Functional Tests   Functional tests Squat;Single leg stance      Squat   Comments lumbar flexion after midway      Single Leg Stance   Comments Lt SLS hip drop      Posture/Postural Control   Posture/Postural Control Postural limitations    Postural Limitations Rounded Shoulders      ROM / Strength   AROM / PROM / Strength Strength;AROM      AROM   Overall AROM Comments lumbar flexion and Rt SB normal but painful on left side      Strength   Overall Strength Comments Lt hip abduction and ER - 4/5      Flexibility   Soft Tissue Assessment /Muscle Length yes    Hamstrings 70%      Palpation   SI assessment  normal    Palpation comment Rt lumbar tight and Lt gluteals tight and TTP      Special Tests    Special Tests Lumbar    Lumbar Tests Straight Leg Raise      Straight Leg Raise   Findings Negative    Side  Left                      Objective measurements completed on examination: See above  findings.       [place] Adult PT Treatment/Exercise - 11/11/19 0001      Self-Care   Self-Care Other Self-Care Comments    Other Self-Care Comments  initial HEP as seen in chart                    PT Short Term Goals - 11/11/19 1924      PT SHORT TERM GOAL #1   Title 25% less pain    Time 4    Period Weeks    Status New    Target Date 12/07/19             PT Long Term Goals - 11/11/19 1904      PT LONG TERM GOAL #1   Title Pt will be ind with HEP    Time 12    Period Weeks    Status New    Target Date 02/01/20      PT LONG TERM GOAL #2   Title Pt will report 75% less low back pain when sitting    Time 12    Period Weeks    Status New    Target Date 02/01/20      PT LONG TERM GOAL #3   Title Pt will be able to run and lift weights without pain afterwards due to improved core and hip strength    Time 12    Period Weeks    Status New    Target Date 02/01/20                  Plan - 11/11/19 1814    Clinical Impression Statement Pt presents to clinic today due to ongoing low back pain.  Pain was radiating down the Lt leg but not currently.  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  She demonstrates rounded sitting posture and rounded shoulders.  Pt has pain with lumbar flexion and side bend to the Rt side.  Pt has weakness in Lt hip abduction and external rotation as mentioned above.  Pt has tight  hamstrings and hip flexors bilaterally.  She will benefit from skilled PT to address impairments for return to active healthy lifestyle.    Examination-Activity Limitations Sit    Stability/Clinical Decision Making Stable/Uncomplicated    Clinical Decision Making Low    Rehab Potential Excellent    PT Frequency 1x / week    PT Treatment/Interventions ADLs/Self Care Home Management;Biofeedback;Cryotherapy;Electrical Stimulation;Therapeutic exercise;Patient/family education;Neuromuscular re-education;Manual techniques;Passive range  of motion;Dry needling;Taping    PT Next Visit Plan review posture; f/u on extension stretches, core strength    PT Home Exercise Plan Access Code: [email or web address] (realized after pt left, pt did not receive dry needle info that was printed)    Consulted and Agree with Plan of Care Patient           Patient will benefit from skilled therapeutic intervention in order to improve the following deficits and impairments:  Increased muscle spasms, Impaired flexibility, Pain  Visit Diagnosis: Cramp and spasm  Muscle weakness (generalized)  Chronic left-sided low back pain, unspecified whether sciatica present     Problem List Patient Active Problem List   Diagnosis Date Noted  . Seasonal allergies 10/26/2019  . Menorrhagia 10/26/2019  . Rosacea 10/26/2019  . Depression, recurrent ([place]) 11/01/2016    Jule Ser, PT 11/11/2019, 7:31 PM  [facility] [address], [place], [place], [address] Phone: [phone]   Fax:  [phone]  Name: Bethany Oliver MRN: [id number] Date of Birth: 03-Apr-1970

## 2019-11-09 NOTE — Patient Instructions (Signed)
Access Code: [email or web address] URL: [email or web address] Date: 11/09/2019 Prepared by: Jari Favre  Exercises Static Prone on Elbows - 1 x daily - 7 x weekly - 3 sets - 10 reps Hip Flexor Stretch at Edge of Bed - 1 x daily - 7 x weekly - 3 sets - 10 reps Supine Figure 4 Piriformis Stretch - 1 x daily - 7 x weekly - 3 reps - 1 sets - 30 sec hold Seated Piriformis Stretch with Trunk Bend - 1 x daily - 7 x weekly - 3 reps - 1 sets - 30 sec hold  Patient Education Trigger Point Dry Needling

## 2019-11-11 NOTE — Addendum Note (Signed)
Addended by: Su Hoff on: 11/11/2019 07:32 PM   Modules accepted: Orders

## 2019-11-14 ENCOUNTER — Other Ambulatory Visit: Payer: Self-pay

## 2019-11-14 ENCOUNTER — Encounter: Payer: Self-pay | Admitting: Physical Therapy

## 2019-11-14 ENCOUNTER — Ambulatory Visit: Payer: PRIVATE HEALTH INSURANCE | Admitting: Physical Therapy

## 2019-11-14 DIAGNOSIS — M6281 Muscle weakness (generalized): Secondary | ICD-10-CM

## 2019-11-14 DIAGNOSIS — R252 Cramp and spasm: Secondary | ICD-10-CM

## 2019-11-14 DIAGNOSIS — M545 Low back pain, unspecified: Secondary | ICD-10-CM

## 2019-11-14 NOTE — Patient Instructions (Signed)
Access Code: [email or web address] URL: [email or web address] Date: 11/14/2019 Prepared by: Jari Favre  Exercises Static Prone on Elbows - 1 x daily - 7 x weekly - 3 sets - 10 reps Hip Flexor Stretch at Edge of Bed - 1 x daily - 7 x weekly - 3 sets - 10 reps Supine Figure 4 Piriformis Stretch - 1 x daily - 7 x weekly - 3 reps - 1 sets - 30 sec hold Seated Piriformis Stretch with Trunk Bend - 1 x daily - 7 x weekly - 3 reps - 1 sets - 30 sec hold Quadruped Alternating Leg Extensions - 1 x daily - 7 x weekly - 2 sets - 10 reps Quadruped Fire Hydrant - 1 x daily - 7 x weekly - 2 sets - 10 reps Clamshell with Resistance - 1 x daily - 7 x weekly - 3 sets - 10 reps Supine Hamstring Stretch with Strap - 1 x daily - 7 x weekly - 3 reps - 1 sets - 30 sec hold  Patient Education Trigger Point Dry Needling Office Posture

## 2019-11-14 NOTE — Therapy (Signed)
[facility] [address], [place], [place], [address] Phone: [phone]   Fax:  [phone]  Physical Therapy Treatment  Patient Details  Name: CALEA HRIBAR MRN: [id number] Date of Birth: Jul 17, 1970 Referring Provider (PT): Thurman Coyer, DO   Encounter Date: 11/14/2019   PT End of Session - 11/14/19 1024    Visit Number 2    Date for PT Re-Evaluation 02/01/20    PT Start Time 1021    PT Stop Time 1100    PT Time Calculation (min) 39 min    Activity Tolerance Patient tolerated treatment well    Behavior During Therapy [facility] for tasks assessed/performed           Past Medical History:  Diagnosis Date  . Allergy   . Depression    mild, no hospitalization  . Dry eyes     Past Surgical History:  Procedure Laterality Date  . IVF  2008-20012  . LASIK  05/2011  . LEEP  1999    There were no vitals filed for this visit.   Subjective Assessment - 11/14/19 1024    Subjective The extension stretch seems to be helping    Limitations Sitting    Currently in Pain? No/denies                             [facility] Adult PT Treatment/Exercise - 11/14/19 0001      Self-Care   Self-Care Posture    Posture sitting posture with towel rolls and pillow; reviewed avoid excess flexion      Neuro Re-ed    Neuro Re-ed Details  TrA activation and posture during exercises      Lumbar Exercises: Stretches   Active Hamstring Stretch Right;Left;3 reps;20 seconds    Press Ups 5 reps;10 seconds      Lumbar Exercises: Standing   Other Standing Lumbar Exercises attempt hip abduction with band but was unable to maintain pelvis neutral; no resistance was irritation in hip and turnk lean as well      Lumbar Exercises: Supine   Ab Set 10 reps;3 seconds    Bent Knee Raise 20 reps    Bent Knee Raise Limitations on foam roll and bent knee fallout    Straight Leg Raise 10 reps    Other Supine Lumbar Exercises  side lying clam with blue loop - 20x      Lumbar Exercises: Prone   Straight Leg Raise 20 reps;2 seconds      Lumbar Exercises: Quadruped   Straight Leg Raise 10 reps    Other Quadruped Lumbar Exercises fire hydrant 10 reps                  PT Education - 11/14/19 1118    Education Details Access Code: [email or web address]    Person(s) Educated Patient    Methods Explanation;Demonstration;Tactile cues;Verbal cues;Handout    Comprehension Verbalized understanding;Returned demonstration            PT Short Term Goals - 11/11/19 1924      PT SHORT TERM GOAL #1   Title 25% less pain    Time 4    Period Weeks    Status New    Target Date 12/07/19             PT Long Term Goals - 11/11/19 1904      PT LONG TERM GOAL #1   Title Pt will  be ind with HEP    Time 12    Period Weeks    Status New    Target Date 02/01/20      PT LONG TERM GOAL #2   Title Pt will report 75% less low back pain when sitting    Time 12    Period Weeks    Status New    Target Date 02/01/20      PT LONG TERM GOAL #3   Title Pt will be able to run and lift weights without pain afterwards due to improved core and hip strength    Time 12    Period Weeks    Status New    Target Date 02/01/20                 Plan - 11/14/19 1113    Clinical Impression Statement Pt is responding very well to extension biased exercises.  She had some discomfort with hamstring stretch at the step on Lt side due to inability to keep from stretching into lumbar flexion.  Pt did well with stretch in supine.  Pt was able to start core strength.  She was able to clearly understand how to improve posture in sitting and how to avoid excessive lumbar flexion for long periods of time.  Pt will benefit from skilled PT to continue with POC.    PT Treatment/Interventions ADLs/Self Care Home Management;Biofeedback;Cryotherapy;Electrical Stimulation;Therapeutic exercise;Patient/family education;Neuromuscular  re-education;Manual techniques;Passive range of motion;Dry needling;Taping    PT Next Visit Plan DN to Lt hip, h/s, and possibly quad; porgression of core and Lt hip strength    PT Home Exercise Plan Access Code: [email or web address]    Consulted and Agree with Plan of Care Patient           Patient will benefit from skilled therapeutic intervention in order to improve the following deficits and impairments:  Increased muscle spasms, Impaired flexibility, Pain  Visit Diagnosis: Cramp and spasm  Muscle weakness (generalized)  Chronic left-sided low back pain, unspecified whether sciatica present     Problem List Patient Active Problem List   Diagnosis Date Noted  . Seasonal allergies 10/26/2019  . Menorrhagia 10/26/2019  . Rosacea 10/26/2019  . Depression, recurrent ([place]) 11/01/2016    Jule Ser, PT 11/14/2019, 11:24 AM  [facility] [address] [place], [place], [address] Phone: [phone]   Fax:  [phone]  Name: VAIDEHI BRADDY MRN: [id number] Date of Birth: 26-Apr-1970

## 2019-11-20 ENCOUNTER — Encounter: Payer: PRIVATE HEALTH INSURANCE | Admitting: Physical Therapy

## 2019-11-28 ENCOUNTER — Encounter: Payer: PRIVATE HEALTH INSURANCE | Admitting: Physical Therapy

## 2019-12-04 ENCOUNTER — Encounter: Payer: PRIVATE HEALTH INSURANCE | Admitting: Physical Therapy

## 2019-12-11 ENCOUNTER — Ambulatory Visit: Payer: PRIVATE HEALTH INSURANCE | Attending: Sports Medicine | Admitting: Physical Therapy

## 2019-12-11 ENCOUNTER — Other Ambulatory Visit: Payer: Self-pay

## 2019-12-11 ENCOUNTER — Encounter: Payer: Self-pay | Admitting: Physical Therapy

## 2019-12-11 DIAGNOSIS — M545 Low back pain, unspecified: Secondary | ICD-10-CM | POA: Diagnosis present

## 2019-12-11 DIAGNOSIS — G8929 Other chronic pain: Secondary | ICD-10-CM | POA: Insufficient documentation

## 2019-12-11 DIAGNOSIS — R252 Cramp and spasm: Secondary | ICD-10-CM | POA: Diagnosis present

## 2019-12-11 DIAGNOSIS — M6281 Muscle weakness (generalized): Secondary | ICD-10-CM | POA: Diagnosis present

## 2019-12-11 NOTE — Patient Instructions (Signed)
Access Code: [email or web address] URL: [email or web address] Date: 12/11/2019 Prepared by: Almyra Free  Exercises Static Prone on Elbows - 1 x daily - 7 x weekly - 3 sets - 10 reps Hip Flexor Stretch at Edge of Bed - 1 x daily - 7 x weekly - 3 sets - 10 reps Supine Figure 4 Piriformis Stretch - 1 x daily - 7 x weekly - 3 reps - 1 sets - 30 sec hold Seated Piriformis Stretch with Trunk Bend - 1 x daily - 7 x weekly - 3 reps - 1 sets - 30 sec hold Quadruped Alternating Leg Extensions - 1 x daily - 7 x weekly - 2 sets - 10 reps Quadruped Fire Hydrant - 1 x daily - 7 x weekly - 2 sets - 10 reps Clamshell with Resistance - 1 x daily - 7 x weekly - 3 sets - 10 reps Supine Hamstring Stretch with Strap - 1 x daily - 7 x weekly - 3 reps - 1 sets - 30 sec hold Standing Quadratus Lumborum Stretch with Doorway - 2 x daily - 7 x weekly - 2 reps - 1 sets - 60 sec hold Thoracic Extension Mobilization on Foam Roll - 2 x daily - 7 x weekly - 5 reps - 2 sets Supine ITB Stretch with Strap - 2 x daily - 7 x weekly - 3 reps - 1 sets - 60 sec hold Standing Row with Anchored Resistance - 1 x daily - 7 x weekly - 3 sets - 10 reps Standing Shoulder Extension with Resistance - 1 x daily - 7 x weekly - 3 sets - 10 reps  Patient Education Trigger Point Dry Needling Office Posture

## 2019-12-11 NOTE — Therapy (Signed)
[facility] [address] [place], [place], [address] Phone: [phone]   Fax:  [phone]  Physical Therapy Treatment  Patient Details  Name: [place] Oliver MRN: [id number] Date of Birth: 06-30-1970 Referring Provider (PT): Thurman Coyer, DO   Encounter Date: 12/11/2019   PT End of Session - 12/11/19 0933    Visit Number 3    Date for PT Re-Evaluation 02/01/20    PT Start Time 0933    PT Stop Time 1017    PT Time Calculation (min) 44 min    Activity Tolerance Patient tolerated treatment well    Behavior During Therapy [facility] for tasks assessed/performed           Past Medical History:  Diagnosis Date  . Allergy   . Depression    mild, no hospitalization  . Dry eyes     Past Surgical History:  Procedure Laterality Date  . IVF  2008-20012  . LASIK  05/2011  . LEEP  1999    There were no vitals filed for this visit.   Subjective Assessment - 12/11/19 0934    Subjective It's terrible. Now pain is staying worse in left low back, not just after exercise. 1-2/10 more constant and worse after sitting.    Currently in Pain? No/denies              [facility] PT Assessment - 12/11/19 0001      Palpation   Spinal mobility decreased PA mobility bil lumbar tightness pain at left L2/3; very stiff in mid thoracic spine as well                         [facility] Adult PT Treatment/Exercise - 12/11/19 0001      Self-Care   Self-Care Posture;ADL's    ADL's supine<>sit transfers    Posture sitting posture and use of lumbar support as well as choosing the right chair to sit in to protect her back.      Exercises   Exercises Lumbar      Lumbar Exercises: Stretches   Active Hamstring Stretch Right;Left;1 rep;60 seconds    Active Hamstring Stretch Limitations with strap    Hip Flexor Stretch Limitations Warrior position with SB x 30 sec bil    ITB Stretch Right;Left;1 rep;60 seconds    ITB Stretch  Limitations with strap    Other Lumbar Stretch Exercise chest stretch on foam roller vertically x 1 min      Lumbar Exercises: Standing   Row Strengthening;Both;10 reps;Theraband    Theraband Level (Row) Level 3 (Green)    Shoulder Extension Strengthening;Both;10 reps;Theraband    Theraband Level (Shoulder Extension) Level 3 (Green)      Lumbar Exercises: Supine   Other Supine Lumbar Exercises thoracic extension over foam roller x 10      Lumbar Exercises: Sidelying   Other Sidelying Lumbar Exercises open book x 5 bil      Lumbar Exercises: Prone   Straight Leg Raise 20 reps;2 seconds    Opposite Arm/Leg Raise Right arm/Left leg;Left arm/Right leg;10 reps    Other Prone Lumbar Exercises pelvic press 5 x 5 sec      Lumbar Exercises: Quadruped   Straight Leg Raise 10 reps    Opposite Arm/Leg Raise Right arm/Left leg;Left arm/Right leg;5 reps      Manual Therapy   Manual Therapy Joint mobilization    Manual therapy comments very stiff in mid thoracic  spine with spontaneous manipulation with Gd III mobs    Joint Mobilization lumbar to upper thoracic PA mobs                  PT Education - 12/11/19 1016    Education Details HEP    Person(s) Educated Patient    Methods Explanation;Demonstration;Handout    Comprehension Verbalized understanding;Returned demonstration            PT Short Term Goals - 12/11/19 0938      PT SHORT TERM GOAL #1   Title 25% less pain    Status On-going             PT Long Term Goals - 12/11/19 1657      PT LONG TERM GOAL #1   Title Pt will be ind with HEP    Status On-going      PT LONG TERM GOAL #2   Title Pt will report 75% less low back pain when sitting    Status On-going      PT LONG TERM GOAL #3   Title Pt will be able to run and lift weights without pain afterwards due to improved core and hip strength    Status On-going                 Plan - 12/11/19 1017    Clinical Impression Statement Patient presents  after a 3 week break due to illness and death in the family. She reports that her pain has worsened overall becoming more constant in the low back with intermittent radicular sx into Lt LE. She atttributes increased pain to sitting more at work. She demonstrated good core strength with TE today. HEP was progressed and ADL modifications discussed. Hamstrings are still very tight and she reported mid back pain as well today. She has poor sittiing posture and pain is likely due to this and her increased sitting at work.    PT Frequency 1x / week    PT Treatment/Interventions ADLs/Self Care Home Management;Biofeedback;Cryotherapy;Electrical Stimulation;Therapeutic exercise;Patient/family education;Neuromuscular re-education;Manual techniques;Passive range of motion;Dry needling;Taping    PT Next Visit Plan DN to Lt hip, h/s, and possibly quad; porgression of core and Lt hip strength    PT Home Exercise Plan Access Code: [email or web address]    Consulted and Agree with Plan of Care Patient           Patient will benefit from skilled therapeutic intervention in order to improve the following deficits and impairments:  Increased muscle spasms, Impaired flexibility, Pain  Visit Diagnosis: Cramp and spasm  Muscle weakness (generalized)  Chronic left-sided low back pain, unspecified whether sciatica present     Problem List Patient Active Problem List   Diagnosis Date Noted  . Seasonal allergies 10/26/2019  . Menorrhagia 10/26/2019  . Rosacea 10/26/2019  . Depression, recurrent ([place]) 11/01/2016    Madelyn Flavors PT 12/11/2019, 4:57 PM  [facility] [address], [place], [place], [address] Phone: [phone]   Fax:  [phone]  Name: MIRRANDA MONRROY MRN: [id number] Date of Birth: 1970/04/30

## 2019-12-24 ENCOUNTER — Encounter: Payer: PRIVATE HEALTH INSURANCE | Admitting: Physical Therapy

## 2020-01-01 ENCOUNTER — Ambulatory Visit: Payer: PRIVATE HEALTH INSURANCE | Attending: Sports Medicine | Admitting: Physical Therapy

## 2020-01-01 ENCOUNTER — Other Ambulatory Visit: Payer: Self-pay

## 2020-01-01 DIAGNOSIS — R252 Cramp and spasm: Secondary | ICD-10-CM | POA: Insufficient documentation

## 2020-01-01 DIAGNOSIS — G8929 Other chronic pain: Secondary | ICD-10-CM | POA: Diagnosis present

## 2020-01-01 DIAGNOSIS — M6281 Muscle weakness (generalized): Secondary | ICD-10-CM | POA: Insufficient documentation

## 2020-01-01 DIAGNOSIS — M545 Low back pain, unspecified: Secondary | ICD-10-CM | POA: Insufficient documentation

## 2020-01-01 NOTE — Therapy (Signed)
[facility] [address] [place], [place], [address] Phone: [phone]   Fax:  [phone]  Physical Therapy Treatment  Patient Details  Name: [place] MRN: [id number] Date of Birth: 10/17/1970 Referring Provider (PT): Thurman Coyer, DO   Encounter Date: 01/01/2020   PT End of Session - 01/01/20 0924    Visit Number 4    Date for PT Re-Evaluation 02/01/20    PT Start Time 0846    PT Stop Time 0925    PT Time Calculation (min) 39 min    Activity Tolerance Patient tolerated treatment well    Behavior During Therapy [facility] for tasks assessed/performed           Past Medical History:  Diagnosis Date   Allergy    Depression    mild, no hospitalization   Dry eyes     Past Surgical History:  Procedure Laterality Date   IVF  2008-20012   LASIK  05/2011   LEEP  1999    There were no vitals filed for this visit.   Subjective Assessment - 01/01/20 1354    Subjective I have been doing less running and that has helped.  I can run 3 miles without causing pain    Currently in Pain? No/denies                             [facility] Adult PT Treatment/Exercise - 01/01/20 0001      Ambulation/Gait   Gait Comments TM walking and noticing where she is placing force and pelvic alignment; discussed smaller steps      Lumbar Exercises: Stretches   Active Hamstring Stretch Right;Left;1 rep;60 seconds    Quad Stretch Right;Left;2 reps;30 seconds      Manual Therapy   Manual Therapy Soft tissue mobilization    Soft tissue mobilization Lt quad, hamstrings, lumbar and glutes            Trigger Point Dry Needling - 01/01/20 0001    Consent Given? Yes    Education Handout Provided Previously provided    Muscles Treated Lower Quadrant Quadriceps;Hamstring    Muscles Treated Back/Hip Gluteus medius;Gluteus minimus;Lumbar multifidi    Dry Needling Comments Lt side only    Quadriceps  Response Twitch response elicited;Palpable increased muscle length    Hamstring Response Twitch response elicited;Palpable increased muscle length    Gluteus Minimus Response Twitch response elicited;Palpable increased muscle length    Gluteus Medius Response Twitch response elicited;Palpable increased muscle length    Lumbar multifidi Response Twitch response elicited;Palpable increased muscle length                PT Education - 01/01/20 0923    Education Details Access Code: [email or web address]    Person(s) Educated Patient    Methods Explanation;Demonstration;Tactile cues;Verbal cues;Handout    Comprehension Verbalized understanding;Returned demonstration            PT Short Term Goals - 12/11/19 0938      PT SHORT TERM GOAL #1   Title 25% less pain    Status On-going             PT Long Term Goals - 12/11/19 1657      PT LONG TERM GOAL #1   Title Pt will be ind with HEP    Status On-going      PT LONG TERM GOAL #2   Title Pt will report 75% less  low back pain when sitting    Status On-going      PT LONG TERM GOAL #3   Title Pt will be able to run and lift weights without pain afterwards due to improved core and hip strength    Status On-going                 Plan - 01/01/20 1355    Clinical Impression Statement Pt responded well to DN and Manual techniques today.  Pt had greater flexibility of quads on the Lt side and improved soft tissue length throughout regions that needling was applied.  Pt demosntrates a firm heel strike during fast walking and reports that she heel strikes with running as well.  Pt was educated on how to take note of pelvic drops or Trendelenburg in her gait as she is running.  Pt will benefit from skilled PT to continue to work on improved soft tissue length and core and hip strengthening to imbalances for running without pain.    PT Treatment/Interventions ADLs/Self Care Home Management;Biofeedback;Cryotherapy;Electrical  Stimulation;Therapeutic exercise;Patient/family education;Neuromuscular re-education;Manual techniques;Passive range of motion;Dry needling;Taping    PT Next Visit Plan f/u to DN to Lt hip, h/s, multifidi lumbar, quad; porgression of core and Lt hip strength; f/u on observations during running and if she was able to take smaller steps when running    PT Home Exercise Plan Access Code: [email or web address]    Consulted and Agree with Plan of Care Patient           Patient will benefit from skilled therapeutic intervention in order to improve the following deficits and impairments:  Increased muscle spasms, Impaired flexibility, Pain  Visit Diagnosis: Cramp and spasm  Muscle weakness (generalized)  Chronic left-sided low back pain, unspecified whether sciatica present     Problem List Patient Active Problem List   Diagnosis Date Noted   Seasonal allergies 10/26/2019   Menorrhagia 10/26/2019   Rosacea 10/26/2019   Depression, recurrent ([place]) 11/01/2016    Jule Ser, PT 01/01/2020, 2:01 PM  [facility] [address] [place], [place], [address] Phone: [phone]   Fax:  [phone]  Name: [place] Oliver MRN: [id number] Date of Birth: 1970/10/17

## 2020-01-01 NOTE — Patient Instructions (Signed)
Access Code: [email or web address] URL: [email or web address] Date: 01/01/2020 Prepared by: Jari Favre  Exercises Static Prone on Elbows - 1 x daily - 7 x weekly - 3 sets - 10 reps Hip Flexor Stretch at Edge of Bed - 1 x daily - 7 x weekly - 3 sets - 10 reps Supine Figure 4 Piriformis Stretch - 1 x daily - 7 x weekly - 3 reps - 1 sets - 30 sec hold Seated Piriformis Stretch with Trunk Bend - 1 x daily - 7 x weekly - 3 reps - 1 sets - 30 sec hold Quadruped Alternating Leg Extensions - 1 x daily - 7 x weekly - 2 sets - 10 reps Quadruped Fire Hydrant - 1 x daily - 7 x weekly - 2 sets - 10 reps Clamshell with Resistance - 1 x daily - 7 x weekly - 3 sets - 10 reps Supine Hamstring Stretch with Strap - 1 x daily - 7 x weekly - 3 reps - 1 sets - 30 sec hold Standing Quadratus Lumborum Stretch with Doorway - 2 x daily - 7 x weekly - 2 reps - 1 sets - 60 sec hold Thoracic Extension Mobilization on Foam Roll - 2 x daily - 7 x weekly - 5 reps - 2 sets Supine ITB Stretch with Strap - 2 x daily - 7 x weekly - 3 reps - 1 sets - 60 sec hold Standing Row with Anchored Resistance - 1 x daily - 7 x weekly - 3 sets - 10 reps Standing Shoulder Extension with Resistance - 1 x daily - 7 x weekly - 3 sets - 10 reps Prone Quadriceps Stretch with Strap - 1 x daily - 7 x weekly - 3 reps - 1 sets - 30sec hold  Patient Education Trigger Point Dry Needling Office Posture

## 2020-01-03 MED ORDER — NORETHINDRONE 0.35 MG PO TABS: 1.0000 | ORAL_TABLET | Freq: Every day | ORAL | 6 refills | Status: DC

## 2020-01-04 ENCOUNTER — Encounter: Payer: PRIVATE HEALTH INSURANCE | Admitting: Physical Therapy

## 2020-01-07 ENCOUNTER — Other Ambulatory Visit: Payer: Self-pay

## 2020-01-07 ENCOUNTER — Encounter: Payer: Self-pay | Admitting: Physical Therapy

## 2020-01-07 ENCOUNTER — Ambulatory Visit: Payer: PRIVATE HEALTH INSURANCE | Admitting: Physical Therapy

## 2020-01-07 DIAGNOSIS — G8929 Other chronic pain: Secondary | ICD-10-CM

## 2020-01-07 DIAGNOSIS — R252 Cramp and spasm: Secondary | ICD-10-CM | POA: Diagnosis not present

## 2020-01-07 DIAGNOSIS — M545 Low back pain, unspecified: Secondary | ICD-10-CM

## 2020-01-07 DIAGNOSIS — M6281 Muscle weakness (generalized): Secondary | ICD-10-CM

## 2020-01-07 NOTE — Therapy (Signed)
[facility] [address], [place], [place], [address] Phone: [phone]   Fax:  [phone]  Physical Therapy Treatment  Patient Details  Name: [place] Oliver MRN: [id number] Date of Birth: [birth year] Referring Provider (PT): Thurman Coyer, DO   Encounter Date: 01/07/2020   PT End of Session - 01/07/20 1447    Visit Number 5    Date for PT Re-Evaluation 02/01/20    PT Start Time 1447    PT Stop Time 1527    PT Time Calculation (min) 40 min    Activity Tolerance Patient tolerated treatment well    Behavior During Therapy [facility] for tasks assessed/performed           Past Medical History:  Diagnosis Date   Allergy    Depression    mild, no hospitalization   Dry eyes     Past Surgical History:  Procedure Laterality Date   IVF  2008-20012   LASIK  05/2011   LEEP  1999    There were no vitals filed for this visit.   Subjective Assessment - 01/07/20 1448    Subjective I had a lot of soreness 2 days after the dry needling.  Just a little soreness today    Currently in Pain? Yes    Pain Score 2     Pain Location Back    Pain Orientation Left;Lower    Pain Descriptors / Indicators Sore    Pain Type Chronic pain    Pain Onset More than a month ago    Pain Frequency Intermittent    Multiple Pain Sites No                             [facility] Adult PT Treatment/Exercise - 01/07/20 0001      Lumbar Exercises: Stretches   Quad Stretch Right;Left;2 reps;30 seconds      Lumbar Exercises: Sidelying   Hip Abduction Right;Left;20 reps   cues to keep toes pointing fwd     Lumbar Exercises: Quadruped   Other Quadruped Lumbar Exercises fire hydrant and donkey kicks x 5 - needed a lot of TC to stabilize      Manual Therapy   Soft tissue mobilization quads bil, lumbar bil; hamstring Lt, hip flexors bil; glutes bil            Trigger Point Dry Needling - 01/07/20 0001    Consent  Given? Yes    Education Handout Provided Previously provided    Dry Needling Comments Lt side all except bilat lumbar and quads    Quadriceps Response Twitch response elicited;Palpable increased muscle length    Hamstring Response Twitch response elicited;Palpable increased muscle length    Gluteus Minimus Response Twitch response elicited;Palpable increased muscle length    Gluteus Medius Response Twitch response elicited;Palpable increased muscle length    Lumbar multifidi Response Twitch response elicited;Palpable increased muscle length                  PT Short Term Goals - 12/11/19 0938      PT SHORT TERM GOAL #1   Title 25% less pain    Status On-going             PT Long Term Goals - 12/11/19 1657      PT LONG TERM GOAL #1   Title Pt will be ind with HEP    Status On-going  PT LONG TERM GOAL #2   Title Pt will report 75% less low back pain when sitting    Status On-going      PT LONG TERM GOAL #3   Title Pt will be able to run and lift weights without pain afterwards due to improved core and hip strength    Status On-going                 Plan - 01/07/20 1545    Clinical Impression Statement Pt responded well to DN and STM.  She had increased flexibility in the quads and added bilat DN to quads and lumbar spine today.  Pt demonstrated some hip drop on the Rt side during running gait.  Only did 2 min running so this did not give a clear picture of what she looks like after 3 miles.  Pt was given exercises for improved hip glute med strength for improved running gait.  Pt will benefit from skilled PT to address core and hip strength.    PT Treatment/Interventions ADLs/Self Care Home Management;Biofeedback;Cryotherapy;Electrical Stimulation;Therapeutic exercise;Patient/family education;Neuromuscular re-education;Manual techniques;Passive range of motion;Dry needling;Taping    PT Next Visit Plan f/u to DN #2; f/u on running and progress glute med and  core strength    PT Home Exercise Plan Access Code: [email or web address]    Consulted and Agree with Plan of Care Patient           Patient will benefit from skilled therapeutic intervention in order to improve the following deficits and impairments:  Increased muscle spasms, Impaired flexibility, Pain  Visit Diagnosis: Cramp and spasm  Muscle weakness (generalized)  Chronic left-sided low back pain, unspecified whether sciatica present     Problem List Patient Active Problem List   Diagnosis Date Noted   Seasonal allergies 10/26/2019   Menorrhagia 10/26/2019   Rosacea 10/26/2019   Depression, recurrent ([place]) 11/01/2016    Jule Ser, PT 01/07/2020, 4:18 PM  [facility] [address], [place], [place], [address] Phone: [phone]   Fax:  [phone]  Name: [place] Oliver MRN: [id number] Date of Birth: April 06, 1970

## 2020-01-23 ENCOUNTER — Ambulatory Visit: Payer: PRIVATE HEALTH INSURANCE | Attending: Sports Medicine | Admitting: Physical Therapy

## 2020-01-23 ENCOUNTER — Encounter: Payer: Self-pay | Admitting: Physical Therapy

## 2020-01-23 ENCOUNTER — Other Ambulatory Visit: Payer: Self-pay

## 2020-01-23 DIAGNOSIS — M6281 Muscle weakness (generalized): Secondary | ICD-10-CM | POA: Insufficient documentation

## 2020-01-23 DIAGNOSIS — R252 Cramp and spasm: Secondary | ICD-10-CM | POA: Diagnosis not present

## 2020-01-23 DIAGNOSIS — G8929 Other chronic pain: Secondary | ICD-10-CM | POA: Diagnosis present

## 2020-01-23 DIAGNOSIS — M545 Low back pain, unspecified: Secondary | ICD-10-CM | POA: Insufficient documentation

## 2020-01-23 NOTE — Patient Instructions (Signed)
Access Code: [email or web address] URL: [email or web address] Date: 01/23/2020 Prepared by: Almyra Free  Exercises Prone Press Up - 3-4 x daily - 7 x weekly - 1-3 sets - 10 reps Active Hip Flexor Stretch - 2 x daily - 7 x weekly - 1 sets - 10 reps Supine Figure 4 Piriformis Stretch - 1 x daily - 7 x weekly - 3 reps - 1 sets - 30 sec hold Seated Piriformis Stretch with Trunk Bend - 1 x daily - 7 x weekly - 3 reps - 1 sets - 30 sec hold Supine Hamstring Stretch with Strap - 1 x daily - 7 x weekly - 3 reps - 1 sets - 30 sec hold Supine ITB Stretch with Strap - 2 x daily - 7 x weekly - 3 reps - 1 sets - 60 sec hold Prone Quadriceps Stretch with Strap - 1 x daily - 7 x weekly - 3 reps - 1 sets - 30sec hold Reverse Clamshell in Extension and Abduction - 1 x daily - 7 x weekly - 1-3 sets - 10 reps Quadruped Alternating Leg Extensions - 1 x daily - 7 x weekly - 2 sets - 10 reps Quadruped Fire Hydrant - 1 x daily - 7 x weekly - 2 sets - 10 reps Sidelying Hip Abduction - 1 x daily - 7 x weekly - 3 sets - 20 reps Thoracic Extension Mobilization on Foam Roll - 2 x daily - 7 x weekly - 5 reps - 2 sets Standing Row with Anchored Resistance - 1 x daily - 7 x weekly - 3 sets - 10 reps Standing Shoulder Extension with Resistance - 1 x daily - 7 x weekly - 3 sets - 10 reps  Patient Education Audiological scientist

## 2020-01-23 NOTE — Therapy (Signed)
[facility] [address] [place], [place], [address] Phone: [phone]   Fax:  [phone]  Physical Therapy Treatment  Patient Details  Name: Bethany Oliver MRN: [id number] Date of Birth: 05-Oct-1970 Referring Provider ([facility]): Thurman Coyer, DO   Encounter Date: 01/23/2020   Oliver End of Session - 01/23/20 1102    Visit Number 6    Date for Oliver Re-Evaluation 02/01/20    Authorization Type [employer]    Oliver Start Time 1102    Oliver Stop Time 1146    Oliver Time Calculation (min) 44 min    Activity Tolerance Patient tolerated treatment well    Behavior During Therapy [facility] for tasks assessed/performed           Past Medical History:  Diagnosis Date  . Allergy   . Depression    mild, no hospitalization  . Dry eyes     Past Surgical History:  Procedure Laterality Date  . IVF  2008-20012  . LASIK  05/2011  . LEEP  1999    There were no vitals filed for this visit.   Subjective Assessment - 01/23/20 1102    Subjective Left side is tight. I just did a spin class. Still have ache in left low back and weird feeling in leg. Not doing lifting and HIIT classes. The stretches are helping.    Currently in Pain? Yes    Pain Score 1     Pain Location Back    Pain Orientation Left;Lower    Pain Descriptors / Indicators Sore    Pain Type Chronic pain    Pain Radiating Towards ache down front of leg now    Aggravating Factors  prolonged sitting, running > 3 miles    Pain Relieving Factors press ups                             [facility] Adult [facility] Treatment/Exercise - 01/23/20 0001      Self-Care   Self-Care ADL's    ADL's basic mod of squat or kneel vs. bend; lifting technique      Lumbar Exercises: Stretches   Press Ups Limitations 3x10 assessing Lt LE after each set; sx abolished by last set; also used exhale at top of press up    Other Lumbar Stretch Exercise reviewed stretches in HEP    Other Lumbar  Stretch Exercise Warrior 1 with sidebend and active heel raise x 10 each side      Lumbar Exercises: Standing   Functional Squats Limitations Oliver demonstrated with correct form    Other Standing Lumbar Exercises SLS bil; left LE visibly shakes and she uses left trunk lean to stabilize      Lumbar Exercises: Sidelying   Clam Limitations reverse clam with hip ABD and ext x 5     Other Sidelying Lumbar Exercises over one pillow on right side with left trunk rotation x 2 min      Lumbar Exercises: Quadruped   Straight Leg Raise 5 reps    Straight Leg Raises Limitations bil; needs to adjust right pelvis down when raising right LE      Manual Therapy   Manual Therapy Joint mobilization    Joint Mobilization Gd III CPA and UPA mobs to lumbar spine   pain at left L1/2 and L2/3; tight in right lumbar  Oliver Education - 01/23/20 1648    Education Details HEP modified and progressed; ADL modifications and basic lifting h/o issued    Person(s) Educated Patient    Methods Explanation;Demonstration;Handout;Verbal cues;Tactile cues    Comprehension Verbalized understanding;Returned demonstration            Oliver Short Term Goals - 01/23/20 1129      Oliver SHORT TERM GOAL #1   Title 25% less pain    Status Achieved             Oliver Long Term Goals - 01/23/20 1127      Oliver LONG TERM GOAL #1   Title Oliver will be ind with HEP    Status Partially Met      Oliver LONG TERM GOAL #2   Title Oliver will report 75% less low back pain when sitting    Status On-going      Oliver LONG TERM GOAL #3   Title Oliver will be able to run and lift weights without pain afterwards due to improved core and hip strength    Status Deferred      Oliver LONG TERM GOAL #4   Title Patient to report no LE symptoms with ADLS or exercising    Status New      Oliver LONG TERM GOAL #5   Title Patient able to perform SL activities on the left with improved strength by 75%  per subjective report.    Time 6    Period  Weeks    Status New                 Plan - 01/23/20 1650    Clinical Impression Statement Patient presents after 2 week absence from [facility] reporting stiffness in her back and Lt LE after completing a spin class before arriving. She has stopped her HIIT workouts and lifting. She is still limited to 3 miles running. We reviewed her goals and HEP. She was able to centralize her symptoms with prone press ups. PA mobs felt good in lower lumbar, but were painful at left L1/2 and L2/3. Her right lumbar was tight but not painful. She also had relief in right SDLY over a pillow with left upper trunk rotation. She was adivsed to centralize her LE sx asap with either this position or press ups when they occur. She still demos weakness in the left hip gluteals and with SLS activities compared to Rt. She will benefit from continued Oliver to address these deficits.    Examination-Activity Limitations Sit    Oliver Frequency 1x / week    Oliver Treatment/Interventions ADLs/Self Care Home Management;Biofeedback;Cryotherapy;Electrical Stimulation;Therapeutic exercise;Patient/family education;Neuromuscular re-education;Manual techniques;Passive range of motion;Dry needling;Taping    Oliver Next Visit Plan Re-cert 1x/wk for 8 wks (due to holiday); assess response to press ups; go over ADL mods/lifting; f/u on running; progress glute med/core strength    Oliver Home Exercise Plan [email or web address]    Consulted and Agree with Plan of Care Patient           Patient will benefit from skilled therapeutic intervention in order to improve the following deficits and impairments:  Increased muscle spasms, Impaired flexibility, Pain  Visit Diagnosis: Cramp and spasm  Muscle weakness (generalized)  Chronic left-sided low back pain, unspecified whether sciatica present     Problem List Patient Active Problem List   Diagnosis Date Noted  . Seasonal allergies 10/26/2019  . Menorrhagia 10/26/2019  . Rosacea 10/26/2019  . Depression,  recurrent ([place]) 11/01/2016  Bethany Oliver 01/23/2020, 5:07 PM  [facility] [address] [place], [place], [address] Phone: [phone]   Fax:  [phone]  Name: Bethany Oliver MRN: [id number] Date of Birth: 18-Dec-1970

## 2020-01-28 ENCOUNTER — Ambulatory Visit: Payer: PRIVATE HEALTH INSURANCE | Admitting: Physical Therapy

## 2020-01-28 ENCOUNTER — Other Ambulatory Visit: Payer: Self-pay

## 2020-01-28 DIAGNOSIS — R252 Cramp and spasm: Secondary | ICD-10-CM

## 2020-01-28 DIAGNOSIS — M6281 Muscle weakness (generalized): Secondary | ICD-10-CM

## 2020-01-28 DIAGNOSIS — M545 Low back pain, unspecified: Secondary | ICD-10-CM

## 2020-01-28 DIAGNOSIS — G8929 Other chronic pain: Secondary | ICD-10-CM

## 2020-01-28 NOTE — Therapy (Addendum)
[facility] [address] [place], [place], [address] Phone: [phone]   Fax:  [phone]  Physical Therapy Treatment  Patient Details  Name: Bethany Oliver MRN: [id number] Date of Birth: April 23, 1970 Referring Provider (PT): Thurman Coyer, DO   Encounter Date: 01/28/2020   PT End of Session - 01/28/20 1647    Visit Number 7    Date for PT Re-Evaluation 03/24/20    PT Start Time 1238    PT Stop Time 1316    PT Time Calculation (min) 38 min    Activity Tolerance Patient tolerated treatment well    Behavior During Therapy [facility] for tasks assessed/performed           Past Medical History:  Diagnosis Date  . Allergy   . Depression    mild, no hospitalization  . Dry eyes     Past Surgical History:  Procedure Laterality Date  . IVF  2008-20012  . LASIK  05/2011  . LEEP  1999    There were no vitals filed for this visit.   Subjective Assessment - 01/28/20 1302    Subjective The extension is helping.  It hurt the least when swimming but had some numbness in my toe.    Currently in Pain? No/denies              [facility] PT Assessment - 01/29/20 0001      Assessment   Medical Diagnosis M54.5,G89.29 (ICD-10-CM) - Chronic left-sided low back pain, unspecified whether sciatica present    Referring Provider (PT) Thurman Coyer, DO      Strength   Overall Strength Comments Lt hip abduction and ER - 4/5                         [facility] Adult PT Treatment/Exercise - 01/29/20 0001      Manual Therapy   Manual therapy comments skilled palpation quads, glutes, psoas and STM during Dry needling treatment    Soft tissue mobilization quads bil, lumbar bil; hamstring Lt, hip flexors bil; glutes bil            Trigger Point Dry Needling - 01/28/20 0001    Consent Given? Yes    Education Handout Provided Previously provided    Muscles Treated Back/Hip Iliopsoas    Quadriceps Response Twitch  response elicited;Palpable increased muscle length    Gluteus Minimus Response Twitch response elicited;Palpable increased muscle length    Gluteus Medius Response Twitch response elicited;Palpable increased muscle length    Iliopsoas Response Twitch response elicited;Palpable increased muscle length                  PT Short Term Goals - 01/23/20 1129      PT SHORT TERM GOAL #1   Title 25% less pain    Status Achieved             PT Long Term Goals - 01/23/20 1127      PT LONG TERM GOAL #1   Title Pt will be ind with HEP    Status Partially Met      PT LONG TERM GOAL #2   Title Pt will report 75% less low back pain when sitting    Status On-going      PT LONG TERM GOAL #3   Title Pt will be able to run and lift weights without pain afterwards due to improved core and hip strength  Status Deferred      PT LONG TERM GOAL #4   Title Patient to report no LE symptoms with ADLS or exercising    Status New      PT LONG TERM GOAL #5   Title Patient able to perform SL activities on the left with improved strength by 75%  per subjective report.    Time 6    Period Weeks    Status New                 Plan - 01/29/20 2229    Clinical Impression Statement Pt had setback due to pulling calf muscle since last visit.  Overall, she feels better but continues to have weakness and pain into the Lt LE.  Pt with updated goals and goals added at previous visit.  Today's session focused on pain management as she recently received updated exercises which are still challenging for her at this time. She demonstrates weakness as mentioned above and will continue to benefit from skilled PT to address deficits.    Examination-Activity Limitations Sit    Rehab Potential Excellent    PT Frequency 1x / week    PT Duration 8 weeks    PT Treatment/Interventions ADLs/Self Care Home Management;Biofeedback;Cryotherapy;Electrical Stimulation;Therapeutic exercise;Patient/family  education;Neuromuscular re-education;Manual techniques;Passive range of motion;Dry needling;Taping;Gait training;Therapeutic activities    PT Next Visit Plan Re-cert 1x/wk for 8 wks (due to holiday); assess response to press ups; go over ADL mods/lifting; f/u on running; progress glute med/core strength    PT Home Exercise Plan [email or web address]    Consulted and Agree with Plan of Care Patient           Patient will benefit from skilled therapeutic intervention in order to improve the following deficits and impairments:  Increased muscle spasms, Impaired flexibility, Pain  Visit Diagnosis: Cramp and spasm  Muscle weakness (generalized)  Chronic left-sided low back pain, unspecified whether sciatica present     Problem List Patient Active Problem List   Diagnosis Date Noted  . Seasonal allergies 10/26/2019  . Menorrhagia 10/26/2019  . Rosacea 10/26/2019  . Depression, recurrent ([place]) 11/01/2016    Jule Ser, PT 01/29/2020, 10:36 PM  [facility] [address] [place], [place], [address] Phone: [phone]   Fax:  [phone]  Name: Bethany [facility] MRN: [id number] Date of Birth: January 16, 1971

## 2020-01-29 NOTE — Addendum Note (Signed)
Addended by: Su Hoff on: 01/29/2020 10:37 PM   Modules accepted: Orders

## 2020-02-21 ENCOUNTER — Encounter: Payer: PRIVATE HEALTH INSURANCE | Admitting: Physical Therapy

## 2020-02-26 ENCOUNTER — Other Ambulatory Visit: Payer: Self-pay

## 2020-02-26 ENCOUNTER — Encounter: Payer: Self-pay | Admitting: Family Medicine

## 2020-02-26 ENCOUNTER — Encounter: Payer: Self-pay | Admitting: Physical Therapy

## 2020-02-26 ENCOUNTER — Ambulatory Visit: Payer: PRIVATE HEALTH INSURANCE | Attending: Sports Medicine | Admitting: Physical Therapy

## 2020-02-26 DIAGNOSIS — M6281 Muscle weakness (generalized): Secondary | ICD-10-CM | POA: Insufficient documentation

## 2020-02-26 DIAGNOSIS — G8929 Other chronic pain: Secondary | ICD-10-CM | POA: Diagnosis present

## 2020-02-26 DIAGNOSIS — M545 Low back pain, unspecified: Secondary | ICD-10-CM | POA: Diagnosis present

## 2020-02-26 DIAGNOSIS — E559 Vitamin D deficiency, unspecified: Secondary | ICD-10-CM

## 2020-02-26 DIAGNOSIS — E538 Deficiency of other specified B group vitamins: Secondary | ICD-10-CM

## 2020-02-26 DIAGNOSIS — R252 Cramp and spasm: Secondary | ICD-10-CM | POA: Insufficient documentation

## 2020-02-26 DIAGNOSIS — E611 Iron deficiency: Secondary | ICD-10-CM

## 2020-02-26 NOTE — Therapy (Signed)
[facility] [address] [place], [place], [address] Phone: [phone]   Fax:  [phone]  Physical Therapy Treatment  Patient Details  Name: Bethany [facility] MRN: [id number] Date of Birth: 11/01/1970 Referring Provider (PT): Thurman Coyer, DO   Encounter Date: 02/26/2020   PT End of Session - 02/26/20 1449    Visit Number 8    Date for PT Re-Evaluation 03/24/20    Authorization Type [employer]    PT Start Time 1447    PT Stop Time 1530    PT Time Calculation (min) 43 min    Activity Tolerance Patient tolerated treatment well    Behavior During Therapy [facility] for tasks assessed/performed           Past Medical History:  Diagnosis Date  . Allergy   . Depression    mild, no hospitalization  . Dry eyes     Past Surgical History:  Procedure Laterality Date  . IVF  2008-20012  . LASIK  05/2011  . LEEP  1999    There were no vitals filed for this visit.   Subjective Assessment - 02/26/20 1451    Subjective Pt is still having lingering pain but better overall.  States it was feeling better until extensive sitting when traveling flared her up.  Now it is pretty much the same but hasn't had radicular pain and able to do HIIT classes and run up to 3 miles.  Pt would like to be able to run longer miles but feels tight in Lt quad and glutes when running and is nervous to flare something up.    Currently in Pain? Yes    Pain Score 1     Pain Location Back    Pain Orientation Left;Lower    Pain Descriptors / Indicators Sore    Pain Type Chronic pain    Pain Onset More than a month ago    Pain Frequency Intermittent    Multiple Pain Sites No                             [facility] Adult PT Treatment/Exercise - 02/26/20 0001      Lumbar Exercises: Standing   Functional Squats 20 reps    Functional Squats Limitations pt demonstrated with correct form using toe ext and hip ER for improved gluteal  activation; Rt arch with more collapse    Other Standing Lumbar Exercises bird dips; sliders back - 10x each      Lumbar Exercises: Sidelying   Hip Abduction Limitations reviewed and educated to do more of this exercise                  PT Education - 02/26/20 1701    Education Details Access Code: [email or web address]    Person(s) Educated Patient    Methods Explanation;Demonstration;Verbal cues;Tactile cues;Handout    Comprehension Verbalized understanding;Returned demonstration            PT Short Term Goals - 01/23/20 1129      PT SHORT TERM GOAL #1   Title 25% less pain    Status Achieved             PT Long Term Goals - 01/23/20 1127      PT LONG TERM GOAL #1   Title Pt will be ind with HEP    Status Partially Met      PT LONG TERM GOAL #2  Title Pt will report 75% less low back pain when sitting    Status On-going      PT LONG TERM GOAL #3   Title Pt will be able to run and lift weights without pain afterwards due to improved core and hip strength    Status Deferred      PT LONG TERM GOAL #4   Title Patient to report no LE symptoms with ADLS or exercising    Status New      PT LONG TERM GOAL #5   Title Patient able to perform SL activities on the left with improved strength by 75%  per subjective report.    Time 6    Period Weeks    Status New                 Plan - 02/26/20 1629    Clinical Impression Statement Today's session focused on neuro-muscular firing patterns during functional squats and single leg ex's.  Pt has more arch collapse on the Rt LE and medial knee collapse as result when squatting.  Pt was educated in arch lifting with squat and demonstrates improved form.  Pt will continue to benefit from skilled PT to address pelvic stability and core strength.    PT Treatment/Interventions ADLs/Self Care Home Management;Biofeedback;Cryotherapy;Electrical Stimulation;Therapeutic exercise;Patient/family education;Neuromuscular  re-education;Manual techniques;Passive range of motion;Dry needling;Taping;Gait training;Therapeutic activities    PT Next Visit Plan check pelvic alignment; f/u o squats; single leg ex's; side lunge with band; glute med strength; check form with jump squats    PT Home Exercise Plan [email or web address]    Consulted and Agree with Plan of Care Patient           Patient will benefit from skilled therapeutic intervention in order to improve the following deficits and impairments:  Increased muscle spasms,Impaired flexibility,Pain  Visit Diagnosis: Cramp and spasm  Muscle weakness (generalized)  Chronic left-sided low back pain, unspecified whether sciatica present     Problem List Patient Active Problem List   Diagnosis Date Noted  . Seasonal allergies 10/26/2019  . Menorrhagia 10/26/2019  . Rosacea 10/26/2019  . Depression, recurrent ([place]) 11/01/2016    Jule Ser, PT 02/26/2020, 5:02 PM  [facility] [address] [place], [place], [address] Phone: [phone]   Fax:  [phone]  Name: Bethany [facility] MRN: [id number] Date of Birth: 10/05/1970

## 2020-02-26 NOTE — Patient Instructions (Signed)
Access Code: [email or web address] URL: [email or web address] Date: 02/26/2020 Prepared by: Dwana Curd  Exercises Prone Press Up - 3-4 x daily - 7 x weekly - 1-3 sets - 10 reps Active Hip Flexor Stretch - 2 x daily - 7 x weekly - 1 sets - 10 reps Supine Figure 4 Piriformis Stretch - 1 x daily - 7 x weekly - 3 reps - 1 sets - 30 sec hold Seated Piriformis Stretch with Trunk Bend - 1 x daily - 7 x weekly - 3 reps - 1 sets - 30 sec hold Supine Hamstring Stretch with Strap - 1 x daily - 7 x weekly - 3 reps - 1 sets - 30 sec hold Supine ITB Stretch with Strap - 2 x daily - 7 x weekly - 3 reps - 1 sets - 60 sec hold Prone Quadriceps Stretch with Strap - 1 x daily - 7 x weekly - 3 reps - 1 sets - 30sec hold Reverse Clamshell in Extension and Abduction - 1 x daily - 7 x weekly - 1-3 sets - 10 reps Quadruped Alternating Leg Extensions - 1 x daily - 7 x weekly - 2 sets - 10 reps Quadruped Fire Hydrant - 1 x daily - 7 x weekly - 2 sets - 10 reps Sidelying Hip Abduction - 1 x daily - 7 x weekly - 3 sets - 20 reps Thoracic Extension Mobilization on Foam Roll - 2 x daily - 7 x weekly - 5 reps - 2 sets Forward T - 1 x daily - 7 x weekly - 3 sets - 10 reps Goblet Squat with Kettlebell - 1 x daily - 7 x weekly - 3 sets - 10 reps  Patient Education Clinical cytogeneticist

## 2020-02-27 ENCOUNTER — Encounter: Payer: PRIVATE HEALTH INSURANCE | Admitting: Physical Therapy

## 2020-03-04 ENCOUNTER — Other Ambulatory Visit: Payer: Self-pay

## 2020-03-04 ENCOUNTER — Encounter: Payer: Self-pay | Admitting: Physical Therapy

## 2020-03-04 ENCOUNTER — Ambulatory Visit: Payer: PRIVATE HEALTH INSURANCE | Admitting: Physical Therapy

## 2020-03-04 DIAGNOSIS — R252 Cramp and spasm: Secondary | ICD-10-CM

## 2020-03-04 DIAGNOSIS — M6281 Muscle weakness (generalized): Secondary | ICD-10-CM

## 2020-03-04 DIAGNOSIS — G8929 Other chronic pain: Secondary | ICD-10-CM

## 2020-03-04 NOTE — Therapy (Addendum)
[facility] [address], [place], [place], [address] Phone: [phone]   Fax:  [phone]  Physical Therapy Treatment  Patient Details  Name: Bethany Oliver MRN: [id number] Date of Birth: 1970-08-03 Referring Provider (PT): Thurman Coyer, DO   Encounter Date: 03/04/2020   PT End of Session - 03/04/20 1801    Visit Number 9    Date for PT Re-Evaluation 04/29/20    Authorization Type [employer]    PT Start Time 1233    PT Stop Time 1313    PT Time Calculation (min) 40 min    Activity Tolerance Patient tolerated treatment well    Behavior During Therapy [facility] for tasks assessed/performed           Past Medical History:  Diagnosis Date  . Allergy   . Depression    mild, no hospitalization  . Dry eyes     Past Surgical History:  Procedure Laterality Date  . IVF  2008-20012  . LASIK  05/2011  . LEEP  1999    There were no vitals filed for this visit.   Subjective Assessment - 03/10/20 1449    Subjective Pt states the Lt leg feels achy.  Sitting is much better but can tell when I sit too long.    Currently in Pain? Yes    Pain Score 1     Pain Location Back    Pain Orientation Left;Lower    Pain Descriptors / Indicators Sore    Pain Type Chronic pain    Pain Onset More than a month ago    Pain Frequency Intermittent    Multiple Pain Sites No              [facility] PT Assessment - 03/10/20 0001      Assessment   Medical Diagnosis M54.5,G89.29 (ICD-10-CM) - Chronic left-sided low back pain, unspecified whether sciatica present    Referring Provider (PT) Thurman Coyer, DO      Strength   Overall Strength Comments Lt hip abduction and ER 4/5      Palpation   Palpation comment Rt/Lt lumbar tight and Lt gluteals tight and TTP      Special Tests    Special Tests Sacrolliac Tests    Sacroiliac Tests  Gaenslen's Test   Lt SI locked with fwd flex     Pelvic Dictraction   Findings Positive     Side  Left    Comment left side positive for all SI joint tests      Pelvic Compression   Findings Positive      Gaenslen's test   Findings Positive      Sacral Compression   Findings Positive                         [facility] Adult PT Treatment/Exercise - 03/10/20 0001      Manual Therapy   Manual therapy comments skilled palpation quads, glutes, psoas and STM during Dry needling treatment    Soft tissue mobilization quads Lt, lumbar bil;, hip flexors bil; glutes bil            Trigger Point Dry Needling - 03/10/20 0001    Quadriceps Response Twitch response elicited;Palpable increased muscle length    Gluteus Minimus Response Twitch response elicited;Palpable increased muscle length    Gluteus Medius Response Twitch response elicited;Palpable increased muscle length    Lumbar multifidi Response Twitch response elicited;Palpable increased muscle  length    Iliopsoas Response Twitch response elicited;Palpable increased muscle length                  PT Short Term Goals - 01/23/20 1129      PT SHORT TERM GOAL #1   Title 25% less pain    Status Achieved             PT Long Term Goals - 01/23/20 1127      PT LONG TERM GOAL #1   Title Pt will be ind with HEP    Status Partially Met      PT LONG TERM GOAL #2   Title Pt will report 75% less low back pain when sitting    Status On-going      PT LONG TERM GOAL #3   Title Pt will be able to run and lift weights without pain afterwards due to improved core and hip strength    Status Deferred      PT LONG TERM GOAL #4   Title Patient to report no LE symptoms with ADLS or exercising    Status New      PT LONG TERM GOAL #5   Title Patient able to perform SL activities on the left with improved strength by 75%  per subjective report.    Time 6    Period Weeks    Status New                 Plan - 03/10/20 1441    Clinical Impression Statement Today's session focused on reassessment due  to POC ending.  At this time pt is expected to continue to make progress based on overall symptoms improving.  Pt does have positive SI joint provocation tests.  Pt continues to have abnormal compensatory patterns due to Lt SI joint being more locked as well as core and hip weaknesses as mentioned.  Pt has muscle tightness and spasms in the left hip and lumbar regions.  Pt will benefit from skilled PT to continue to address impairments for about 6 more visits.    Examination-Activity Limitations Sit    PT Frequency 1x / week    PT Duration 8 weeks    PT Treatment/Interventions ADLs/Self Care Home Management;Biofeedback;Cryotherapy;Electrical Stimulation;Therapeutic exercise;Patient/family education;Neuromuscular re-education;Manual techniques;Passive range of motion;Dry needling;Taping;Gait training;Therapeutic activities    PT Next Visit Plan check pelvic alignment; f/u o squats; single leg ex's; side lunge with band; glute med strength; check form with jump squats    PT Home Exercise Plan [id number]    Consulted and Agree with Plan of Care Patient           Patient will benefit from skilled therapeutic intervention in order to improve the following deficits and impairments:  Increased muscle spasms,Impaired flexibility,Pain  Visit Diagnosis: Cramp and spasm  Muscle weakness (generalized)  Chronic left-sided low back pain, unspecified whether sciatica present     Problem List Patient Active Problem List   Diagnosis Date Noted  . Seasonal allergies 10/26/2019  . Menorrhagia 10/26/2019  . Rosacea 10/26/2019  . Depression, recurrent ([place]) 11/01/2016    Jule Ser, PT 03/10/2020, 2:50 PM  [facility] [address] [place], [place], [address] Phone: [phone]   Fax:  [phone]  Name: Bethany Oliver MRN: [id number] Date of Birth: 1970/10/15

## 2020-03-05 ENCOUNTER — Encounter: Payer: PRIVATE HEALTH INSURANCE | Admitting: Physical Therapy

## 2020-03-07 ENCOUNTER — Encounter: Payer: PRIVATE HEALTH INSURANCE | Admitting: Physical Therapy

## 2020-03-10 NOTE — Addendum Note (Signed)
Addended by: Su Hoff on: 03/10/2020 02:53 PM   Modules accepted: Orders

## 2020-03-12 ENCOUNTER — Encounter: Payer: PRIVATE HEALTH INSURANCE | Admitting: Physical Therapy

## 2020-03-19 ENCOUNTER — Encounter: Payer: Self-pay | Admitting: Physical Therapy

## 2020-03-19 ENCOUNTER — Ambulatory Visit: Payer: PRIVATE HEALTH INSURANCE | Admitting: Physical Therapy

## 2020-03-19 ENCOUNTER — Other Ambulatory Visit: Payer: Self-pay

## 2020-03-19 DIAGNOSIS — G8929 Other chronic pain: Secondary | ICD-10-CM

## 2020-03-19 DIAGNOSIS — R252 Cramp and spasm: Secondary | ICD-10-CM

## 2020-03-19 DIAGNOSIS — M6281 Muscle weakness (generalized): Secondary | ICD-10-CM

## 2020-03-19 DIAGNOSIS — M545 Low back pain, unspecified: Secondary | ICD-10-CM

## 2020-03-19 NOTE — Therapy (Signed)
[facility] [address], [place], [place], [address] Phone: [phone]   Fax:  [phone]  Physical Therapy Treatment  Patient Details  Name: Bethany Oliver MRN: [id number] Date of Birth: 1970/09/22 Referring Provider (PT): Thurman Coyer, DO   Encounter Date: 03/19/2020   PT End of Session - 03/19/20 1100    Visit Number 10    Date for PT Re-Evaluation 04/29/20    Authorization Type [employer]    PT Start Time 1101    PT Stop Time 1142    PT Time Calculation (min) 41 min    Activity Tolerance Patient tolerated treatment well           Past Medical History:  Diagnosis Date  . Allergy   . Depression    mild, no hospitalization  . Dry eyes     Past Surgical History:  Procedure Laterality Date  . IVF  2008-20012  . LASIK  05/2011  . LEEP  1999    There were no vitals filed for this visit.   Subjective Assessment - 03/19/20 1103    Subjective Pt states she did okay with spin class.  I feel a little sore Lt low back when standing but not bad.    Currently in Pain? No/denies                             [facility] Adult PT Treatment/Exercise - 03/19/20 0001      Lumbar Exercises: Stretches   Piriformis Stretch Left   pidgeon pose   Other Lumbar Stretch Exercise foam roll and lacrosse ball to gluteals for soft tissue release to do at home - educated and performed      Lumbar Exercises: Standing   Other Standing Lumbar Exercises bird dips with same side UE reach - 10x each side      Lumbar Exercises: Sidelying   Hip Abduction Both;20 reps    Other Sidelying Lumbar Exercises plank on knees 10x 5 sec      Lumbar Exercises: Prone   Other Prone Lumbar Exercises plank hip ext 10x with rest break btwn      Manual Therapy   Soft tissue mobilization Lt gluteals; addaday and trigger point release                    PT Short Term Goals - 01/23/20 1129      PT SHORT TERM GOAL #1    Title 25% less pain    Status Achieved             PT Long Term Goals - 01/23/20 1127      PT LONG TERM GOAL #1   Title Pt will be ind with HEP    Status Partially Met      PT LONG TERM GOAL #2   Title Pt will report 75% less low back pain when sitting    Status On-going      PT LONG TERM GOAL #3   Title Pt will be able to run and lift weights without pain afterwards due to improved core and hip strength    Status Deferred      PT LONG TERM GOAL #4   Title Patient to report no LE symptoms with ADLS or exercising    Status New      PT LONG TERM GOAL #5   Title Patient able to perform SL activities on the left  with improved strength by 75%  per subjective report.    Time 6    Period Weeks    Status New                 Plan - 03/19/20 1148    Clinical Impression Statement Today's session focused on glute med strength and pain management with STM techniques and things she can do at home to manage muscle tension in the Lt glutes.  Pt has not had any radicular symptoms even when she did some traveling to [place].  Pt was given updates to HEP for more challengeing core and glute exercises.  She has still not returned to running yet due to weather and she will benefit from skilled PT to ensure she is not having increased symptoms as she gets back to normal activity level    Examination-Activity Limitations Sit    PT Treatment/Interventions ADLs/Self Care Home Management;Biofeedback;Cryotherapy;Electrical Stimulation;Therapeutic exercise;Patient/family education;Neuromuscular re-education;Manual techniques;Passive range of motion;Dry needling;Taping;Gait training;Therapeutic activities    PT Next Visit Plan f/u on bird dips and planks fwd and side - progress as tolerated; glute med strength; maybe add jump squats; STM/DN as needed    PT Home Exercise Plan [id number]    Consulted and Agree with Plan of Care Patient           Patient will benefit from skilled therapeutic  intervention in order to improve the following deficits and impairments:  Increased muscle spasms,Impaired flexibility,Pain  Visit Diagnosis: Cramp and spasm  Muscle weakness (generalized)  Chronic left-sided low back pain, unspecified whether sciatica present     Problem List Patient Active Problem List   Diagnosis Date Noted  . Seasonal allergies 10/26/2019  . Menorrhagia 10/26/2019  . Rosacea 10/26/2019  . Depression, recurrent ([place]) 11/01/2016    Jule Ser, PT 03/19/2020, 12:05 PM  [facility] [address], [place], [place], [address] Phone: [phone]   Fax:  [phone]  Name: Bethany Oliver MRN: [id number] Date of Birth: 1970/08/17

## 2020-03-24 ENCOUNTER — Other Ambulatory Visit: Payer: Self-pay

## 2020-03-24 ENCOUNTER — Ambulatory Visit: Payer: PRIVATE HEALTH INSURANCE

## 2020-03-24 DIAGNOSIS — G8929 Other chronic pain: Secondary | ICD-10-CM

## 2020-03-24 DIAGNOSIS — M545 Other chronic pain: Secondary | ICD-10-CM

## 2020-03-24 DIAGNOSIS — R252 Cramp and spasm: Secondary | ICD-10-CM | POA: Diagnosis not present

## 2020-03-24 DIAGNOSIS — M6281 Muscle weakness (generalized): Secondary | ICD-10-CM

## 2020-03-24 NOTE — Therapy (Signed)
[facility] [address] [place], [place], [address] Phone: [phone]   Fax:  [phone]  Physical Therapy Treatment  Patient Details  Name: Bethany Oliver MRN: [id number] Date of Birth: 1970/12/09 Referring Provider (PT): Thurman Coyer, DO   Encounter Date: 03/24/2020   PT End of Session - 03/24/20 1226    Visit Number 11    PT Start Time 8527    PT Stop Time 1223    PT Time Calculation (min) 35 min    Activity Tolerance Patient tolerated treatment well    Behavior During Therapy [facility] for tasks assessed/performed           Past Medical History:  Diagnosis Date  . Allergy   . Depression    mild, no hospitalization  . Dry eyes     Past Surgical History:  Procedure Laterality Date  . IVF  2008-20012  . LASIK  05/2011  . LEEP  1999    There were no vitals filed for this visit.   Subjective Assessment - 03/24/20 1152    Subjective I feel  70-80% overall improvement in symptoms since the start of care.  I ran 5 miles yesterday and this is the best that I have felt after that distance.    Currently in Pain? No/denies              [facility] PT Assessment - 03/24/20 0001      Assessment   Medical Diagnosis M54.5,G89.29 (ICD-10-CM) - Chronic left-sided low back pain, unspecified whether sciatica present    Referring Provider (PT) Thurman Coyer, DO      Cognition   Overall Cognitive Status Within Functional Limits for tasks assessed      Observation/Other Assessments   Focus on Therapeutic Outcomes (FOTO)  6% limitation      Strength   Overall Strength Comments Lt hip abduction 4+/5                         [facility] Adult PT Treatment/Exercise - 03/24/20 0001      Lumbar Exercises: Standing   Other Standing Lumbar Exercises bird dips with same side UE reach - 10x each side      Lumbar Exercises: Sidelying   Clam 15 reps;Both      Lumbar Exercises: Quadruped   Other  Quadruped Lumbar Exercises fire hydrant and extension- Pt required tactile and verbal cues to stabilize when performed on the Lt                  PT Education - 03/24/20 1230    Education Details verbal review of all HEP using visuals    Person(s) Educated Patient    Methods Explanation;Handout    Comprehension Verbalized understanding            PT Short Term Goals - 01/23/20 1129      PT SHORT TERM GOAL #1   Title 25% less pain    Status Achieved             PT Long Term Goals - 03/24/20 1212      PT LONG TERM GOAL #1   Title Pt will be ind with HEP    Status Achieved      PT LONG TERM GOAL #2   Title Pt will report 75% less low back pain when sitting    Baseline 75-80%    Status Achieved  PT LONG TERM GOAL #3   Title Pt will be able to run and lift weights without pain afterwards due to improved core and hip strength    Baseline able to run 5 miles and do weights    Status Achieved      PT LONG TERM GOAL #4   Title Patient to report no LE symptoms with ADLS or exercising    Status Achieved      PT LONG TERM GOAL #5   Title Patient able to perform SL activities on the left with improved strength by 75%  per subjective report.    Status Achieved                 Plan - 03/24/20 1225    Clinical Impression Statement Pt is ready for D/C to HEP today.  Pt reports 70-80% improvement in symptoms since the start of care. Pt was able to run 5 miles without leg/gluteal pain.  FOTO is improved to 6% limitation.  PT spent session educating pt on importance of gluteal stabilization exercises, core strength and flexibility exercises.  Verbal review of all HEP with some specific demos for technique.  Pt had difficulty stabilizing when performing Lt gluteal activation in quadruped.  Pt will discharge to HEP and continue to advance running as able.    PT Next Visit Plan D/C PT to HEP today    PT Home Exercise Plan [id number]    Consulted and Agree with Plan of  Care Patient           Patient will benefit from skilled therapeutic intervention in order to improve the following deficits and impairments:     Visit Diagnosis: Cramp and spasm  Muscle weakness (generalized)  Chronic left-sided low back pain, unspecified whether sciatica present     Problem List Patient Active Problem List   Diagnosis Date Noted  . Seasonal allergies 10/26/2019  . Menorrhagia 10/26/2019  . Rosacea 10/26/2019  . Depression, recurrent ([place]) 11/01/2016    PHYSICAL THERAPY DISCHARGE SUMMARY  Visits from Start of Care: 11  Current functional level related to goals / functional outcomes: See above for current status.  Pt has met goals and will d/C to HEP   Remaining deficits: Lt gluteal weakness and bil hip stiffness. Pt will work on HEP for continued gains and increase her running mileage as able.     Education / Equipment: HEP, posture/body mechanics Plan: Patient agrees to discharge.  Patient goals were met. Patient is being discharged due to meeting the stated rehab goals.  ?????        Sigurd Sos, PT 03/24/20 12:30 PM  [facility] [address], [place], [place], [address] Phone: [phone]   Fax:  [phone]  Name: Bethany Oliver MRN: [id number] Date of Birth: 08-Jan-1971

## 2020-04-04 ENCOUNTER — Encounter: Payer: PRIVATE HEALTH INSURANCE | Admitting: Physical Therapy

## 2020-04-10 ENCOUNTER — Encounter: Payer: PRIVATE HEALTH INSURANCE | Admitting: Physical Therapy

## 2020-04-16 ENCOUNTER — Other Ambulatory Visit: Payer: Self-pay | Admitting: Obstetrics & Gynecology

## 2020-04-16 DIAGNOSIS — Z1231 Encounter for screening mammogram for malignant neoplasm of breast: Secondary | ICD-10-CM

## 2020-05-21 ENCOUNTER — Ambulatory Visit
Admission: RE | Admit: 2020-05-21 | Discharge: 2020-05-21 | Disposition: A | Discharge status: Home / Self Care | Payer: PRIVATE HEALTH INSURANCE | Source: Ambulatory Visit

## 2020-05-21 ENCOUNTER — Other Ambulatory Visit: Payer: Self-pay

## 2020-05-21 DIAGNOSIS — Z1231 Encounter for screening mammogram for malignant neoplasm of breast: Secondary | ICD-10-CM

## 2020-05-22 ENCOUNTER — Encounter: Payer: Self-pay | Admitting: Family Medicine

## 2020-05-23 ENCOUNTER — Other Ambulatory Visit (INDEPENDENT_AMBULATORY_CARE_PROVIDER_SITE_OTHER): Payer: PRIVATE HEALTH INSURANCE

## 2020-05-23 ENCOUNTER — Other Ambulatory Visit: Payer: Self-pay

## 2020-05-23 DIAGNOSIS — E559 Vitamin D deficiency, unspecified: Secondary | ICD-10-CM

## 2020-05-23 DIAGNOSIS — E538 Deficiency of other specified B group vitamins: Secondary | ICD-10-CM | POA: Diagnosis not present

## 2020-05-23 DIAGNOSIS — E611 Iron deficiency: Secondary | ICD-10-CM

## 2020-05-23 LAB — VITAMIN B12: Vitamin B-12: 433 pg/mL (ref 211–911)

## 2020-05-23 LAB — VITAMIN D 25 HYDROXY (VIT D DEFICIENCY, FRACTURES): VITD: 31.63 ng/mL (ref 30.00–100.00)

## 2020-05-23 LAB — FOLATE: Folate: >23.6 ng/mL (ref >5.9)

## 2020-05-24 LAB — IRON,TIBC AND FERRITIN PANEL
%SAT: 46 % (calc) — ABNORMAL HIGH (ref 16–45)
Ferritin: 22 ng/mL (ref 16–232)
Iron: 178 ug/dL (ref 40–190)
TIBC: 386 mcg/dL (calc) (ref 250–450)

## 2020-05-27 NOTE — Telephone Encounter (Signed)
Addressed in results note

## 2020-06-16 ENCOUNTER — Encounter: Payer: Self-pay | Admitting: Family Medicine

## 2020-06-16 DIAGNOSIS — Z1211 Encounter for screening for malignant neoplasm of colon: Secondary | ICD-10-CM

## 2020-06-17 NOTE — Addendum Note (Signed)
Addended by: Caren Macadam on: 06/17/2020 11:48 AM   Modules accepted: Orders

## 2020-08-27 ENCOUNTER — Ambulatory Visit (INDEPENDENT_AMBULATORY_CARE_PROVIDER_SITE_OTHER): Payer: No Typology Code available for payment source | Admitting: Obstetrics & Gynecology

## 2020-08-27 ENCOUNTER — Other Ambulatory Visit (HOSPITAL_COMMUNITY)
Admission: RE | Admit: 2020-08-27 | Discharge: 2020-08-27 | Disposition: A | Discharge status: Home / Self Care | Payer: No Typology Code available for payment source | Source: Ambulatory Visit | Attending: Obstetrics & Gynecology | Admitting: Obstetrics & Gynecology

## 2020-08-27 ENCOUNTER — Encounter: Payer: Self-pay | Admitting: Obstetrics & Gynecology

## 2020-08-27 ENCOUNTER — Other Ambulatory Visit: Payer: Self-pay

## 2020-08-27 VITALS — BP 106/68 | HR 74 | Resp 16 | Ht 63.25 in | Wt 130.0 lb

## 2020-08-27 DIAGNOSIS — Z01419 Encounter for gynecological examination (general) (routine) without abnormal findings: Secondary | ICD-10-CM | POA: Diagnosis present

## 2020-08-27 DIAGNOSIS — Z3041 Encounter for surveillance of contraceptive pills: Secondary | ICD-10-CM

## 2020-08-27 DIAGNOSIS — N951 Menopausal and female climacteric states: Secondary | ICD-10-CM

## 2020-08-27 MED ORDER — NORETHINDRONE 0.35 MG PO TABS: 1.0000 | ORAL_TABLET | Freq: Every day | ORAL | 4 refills | Status: DC

## 2020-08-27 NOTE — Progress Notes (Signed)
Bethany Oliver Dec 07, 1970 [id number]   History:    49 y.o. G1P0A1 Married.  Pharmacist at [employer].  Adopted daughter 40 yo.   RP:  Established patient presenting for annual gyn exam   HPI: Well on the Progestin-only BCP with menstrual periods mostly every month with normal flow.  Occasional BTB or Oligomenorrhea.  No pelvic pain.  Reports increased headaches post menses, controlled with ibuprofen.  No pain with intercourse.  Urine and bowel movements normal.  Breast normal.  Stable small right breast fibroadenoma, not felt by patient.  Body mass index 22.85.  Running on a regular basis.  Healthy nutrition.  Health labs with family physician.    Past medical history,surgical history, family history and social history were all reviewed and documented in the EPIC chart.  Gynecologic History Patient's last menstrual period was 08/13/2020 (exact date).  Obstetric History OB History  Gravida Para Term Preterm AB Living  1 0     1 0  SAB IAB Ectopic Multiple Live Births  1            # Outcome Date GA Lbr Len/2nd Weight Sex Delivery Anes PTL Lv  1 SAB              ROS: A ROS was performed and pertinent positives and negatives are included in the history.  GENERAL: No fevers or chills. HEENT: No change in vision, no earache, sore throat or sinus congestion. NECK: No pain or stiffness. CARDIOVASCULAR: No chest pain or pressure. No palpitations. PULMONARY: No shortness of breath, cough or wheeze. GASTROINTESTINAL: No abdominal pain, nausea, vomiting or diarrhea, melena or bright red blood per rectum. GENITOURINARY: No urinary frequency, urgency, hesitancy or dysuria. MUSCULOSKELETAL: No joint or muscle pain, no back pain, no recent trauma. DERMATOLOGIC: No rash, no itching, no lesions. ENDOCRINE: No polyuria, polydipsia, no heat or cold intolerance. No recent change in weight. HEMATOLOGICAL: No anemia or easy bruising or bleeding. NEUROLOGIC: No headache, seizures,  numbness, tingling or weakness. PSYCHIATRIC: No depression, no loss of interest in normal activity or change in sleep pattern.     Exam:   BP 106/68   Pulse 74   Resp 16   Ht 5' 3.25" (1.607 m)   Wt 130 lb (59 kg)   LMP 08/13/2020 (Exact Date) Comment: takes micronor  BMI 22.85 kg/m   Body mass index is 22.85 kg/m.  General appearance : Well developed well nourished female. No acute distress HEENT: Eyes: no retinal hemorrhage or exudates,  Neck supple, trachea midline, no carotid bruits, no thyroidmegaly Lungs: Clear to auscultation, no rhonchi or wheezes, or rib retractions  Heart: Regular rate and rhythm, no murmurs or gallops Breast:Examined in sitting and supine position were symmetrical in appearance, no palpable masses or tenderness,  no skin retraction, no nipple inversion, no nipple discharge, no skin discoloration, no axillary or supraclavicular lymphadenopathy Abdomen: no palpable masses or tenderness, no rebound or guarding Extremities: no edema or skin discoloration or tenderness  Pelvic: Vulva: Normal             Vagina: No gross lesions or discharge.  Tampon removed from the vagina (forgotten x last period).    Cervix: No gross lesions or discharge.  Pap reflex done.  Uterus  AV, normal size, shape and consistency, non-tender and mobile  Adnexa  Without masses or tenderness  Anus: Normal   Assessment/Plan:  50 y.o. female for annual exam   1. Encounter for routine gynecological examination with  Papanicolaou smear of cervix Normal gynecologic exam.  For gotten tampon since last.  Removed.  Pap reflex done.  Breast exam normal.  Screening mammogram March 2022 was negative.  Good body mass index at 22.85.  Continue with fitness and healthy nutrition.  Health labs with family physician. - Cytology - PAP( [facility])  2. Perimenopause No menopausal symptoms currently.  We will continue on the progestin pill.  3. Encounter for surveillance of contraceptive  pills Well on the progestin only birth control pills.  No contraindication to continue.  Prescription sent to pharmacy.  Other orders - valACYclovir (VALTREX) 1000 MG tablet; Take by mouth. - XIIDRA 5 % SOLN; Apply 1 drop to eye every morning. - norethindrone (MICRONOR) 0.35 MG tablet; Take 1 tablet (0.35 mg total) by mouth daily.   Princess Bruins MD, 3:15 PM 08/27/2020

## 2020-09-02 LAB — CYTOLOGY - PAP: Diagnosis: NEGATIVE

## 2020-10-24 ENCOUNTER — Other Ambulatory Visit: Payer: Self-pay | Admitting: Family Medicine

## 2020-10-24 ENCOUNTER — Telehealth: Payer: Self-pay

## 2020-10-24 ENCOUNTER — Other Ambulatory Visit: Payer: Self-pay

## 2020-10-24 NOTE — Telephone Encounter (Signed)
Rx done. 

## 2020-10-24 NOTE — Telephone Encounter (Signed)
Patient called requesting Rx refill on buPROPion (WELLBUTRIN XL) 150 MG 24 hr tablet Last ov 10/26/19 Upcoming visit 9/26

## 2020-10-29 ENCOUNTER — Encounter: Payer: PRIVATE HEALTH INSURANCE | Admitting: Family Medicine

## 2020-11-17 ENCOUNTER — Encounter: Payer: No Typology Code available for payment source | Admitting: Family Medicine

## 2020-12-05 LAB — HM COLONOSCOPY

## 2020-12-09 ENCOUNTER — Encounter: Payer: Self-pay | Admitting: Family Medicine

## 2020-12-10 ENCOUNTER — Other Ambulatory Visit: Payer: Self-pay | Admitting: Family Medicine

## 2020-12-10 ENCOUNTER — Other Ambulatory Visit: Payer: Self-pay

## 2020-12-10 ENCOUNTER — Ambulatory Visit (INDEPENDENT_AMBULATORY_CARE_PROVIDER_SITE_OTHER): Payer: No Typology Code available for payment source | Admitting: Family Medicine

## 2020-12-10 VITALS — BP 100/62 | HR 63 | Temp 97.9°F | Ht 63.0 in | Wt 129.5 lb

## 2020-12-10 DIAGNOSIS — Z0000 Encounter for general adult medical examination without abnormal findings: Secondary | ICD-10-CM

## 2020-12-10 DIAGNOSIS — E611 Iron deficiency: Secondary | ICD-10-CM | POA: Diagnosis not present

## 2020-12-10 DIAGNOSIS — Z23 Encounter for immunization: Secondary | ICD-10-CM

## 2020-12-10 DIAGNOSIS — E559 Vitamin D deficiency, unspecified: Secondary | ICD-10-CM

## 2020-12-10 DIAGNOSIS — Z131 Encounter for screening for diabetes mellitus: Secondary | ICD-10-CM | POA: Diagnosis not present

## 2020-12-10 DIAGNOSIS — Z13220 Encounter for screening for lipoid disorders: Secondary | ICD-10-CM

## 2020-12-10 DIAGNOSIS — E538 Deficiency of other specified B group vitamins: Secondary | ICD-10-CM | POA: Diagnosis not present

## 2020-12-10 LAB — COMPREHENSIVE METABOLIC PANEL
ALT: 11 U/L (ref 0–35)
AST: 13 U/L (ref 0–37)
Albumin: 4.6 g/dL (ref 3.5–5.2)
Alkaline Phosphatase: 39 U/L (ref 39–117)
BUN: 13 mg/dL (ref 6–23)
CO2: 27 mEq/L (ref 19–32)
Calcium: 9.4 mg/dL (ref 8.4–10.5)
Chloride: 103 mEq/L (ref 96–112)
Creatinine, Ser: 0.9 mg/dL (ref 0.40–1.20)
GFR: 75.46 mL/min (ref >60.00)
Glucose, Bld: 101 mg/dL — ABNORMAL HIGH (ref 70–99)
Potassium: 4.3 mEq/L (ref 3.5–5.1)
Sodium: 138 mEq/L (ref 135–145)
Total Bilirubin: 0.5 mg/dL (ref 0.2–1.2)
Total Protein: 7 g/dL (ref 6.0–8.3)

## 2020-12-10 LAB — CBC WITH DIFFERENTIAL/PLATELET
Basophils Absolute: 0.1 10*3/uL (ref 0.0–0.1)
Basophils Relative: 1.1 % (ref 0.0–3.0)
Eosinophils Absolute: 0.2 10*3/uL (ref 0.0–0.7)
Eosinophils Relative: 3.2 % (ref 0.0–5.0)
HCT: 41.1 % (ref 36.0–46.0)
Hemoglobin: 13.7 g/dL (ref 12.0–15.0)
Lymphocytes Relative: 23.8 % (ref 12.0–46.0)
Lymphs Abs: 1.5 10*3/uL (ref 0.7–4.0)
MCHC: 33.4 g/dL (ref 30.0–36.0)
MCV: 92.4 fl (ref 78.0–100.0)
Monocytes Absolute: 0.5 10*3/uL (ref 0.1–1.0)
Monocytes Relative: 8.7 % (ref 3.0–12.0)
Neutro Abs: 3.9 10*3/uL (ref 1.4–7.7)
Neutrophils Relative %: 63.2 % (ref 43.0–77.0)
Platelets: 229 10*3/uL (ref 150.0–400.0)
RBC: 4.45 Mil/uL (ref 3.87–5.11)
RDW: 12.9 % (ref 11.5–15.5)
WBC: 6.1 10*3/uL (ref 4.0–10.5)

## 2020-12-10 LAB — VITAMIN D 25 HYDROXY (VIT D DEFICIENCY, FRACTURES): VITD: 44.75 ng/mL (ref 30.00–100.00)

## 2020-12-10 LAB — LIPID PANEL
Cholesterol: 221 mg/dL — ABNORMAL HIGH (ref 0–200)
HDL: 60.7 mg/dL (ref >39.00)
LDL Cholesterol: 146 mg/dL — ABNORMAL HIGH (ref 0–99)
NonHDL: 160.69
Total CHOL/HDL Ratio: 4
Triglycerides: 72 mg/dL (ref 0.0–149.0)
VLDL: 14.4 mg/dL (ref 0.0–40.0)

## 2020-12-10 LAB — FOLATE: Folate: 20.6 ng/mL (ref >5.9)

## 2020-12-10 LAB — VITAMIN B12: Vitamin B-12: 292 pg/mL (ref 211–911)

## 2020-12-10 MED ORDER — BUPROPION HCL ER (XL) 150 MG PO TB24: 150.0000 mg | ORAL_TABLET | Freq: Every day | ORAL | 3 refills | Status: AC

## 2020-12-10 NOTE — Addendum Note (Signed)
Addended by: Isaiah Serge D on: 12/10/2020 09:01 AM   Modules accepted: Orders

## 2020-12-10 NOTE — Progress Notes (Signed)
Bethany Oliver DOB: 12-Oct-1970 Encounter date: 12/10/2020  This is a 50 y.o. female who presents for complete physical   History of present illness/Additional concerns: No concerns today.   Had colonoscopy last Friday - got ten years. Repeat 11/2021.   Cholesterol went up even after cutting out meat. Just does non-red meat now and not every day. Works out daily - on work days walking at lunch and does hit class or run or spin on days off and some weight lifting.   [place] - follows for rosacea - better this year.   Heavy menses better since starting progesterone. Follows with Dr. Dellis Filbert. Had covid beginning September. Feels better with continuation of iron supplements - just does blood builder and flinstone vitamin. Bleeding better and periods more regular. Side effects of progesterone better once she started at night.   Seasonal allergies - doing ok. Just on zyrtec year round. Nasacort if bad.   Mood has been good; still on wellbutrin.    Past Medical History:  Diagnosis Date   Allergy    Depression    mild, no hospitalization   Dry eyes    Ocular rosacea    Rosacea    STD (sexually transmitted disease)    HSV 1   Past Surgical History:  Procedure Laterality Date   IVF  [id number]   LASIK  05/2011   LEEP  1999   No Known Allergies Current Meds  Medication Sig   buPROPion (WELLBUTRIN XL) 150 MG 24 hr tablet Take 1 tablet (150 mg total) by mouth daily.   cetirizine (ZYRTEC) 10 MG tablet Take 10 mg by mouth daily.   Ferrous Gluconate-C-Folic Acid (IRON-C PO)    norethindrone (MICRONOR) 0.35 MG tablet Take 1 tablet (0.35 mg total) by mouth daily.   Omega-3 Fatty Acids (FISH OIL PO) Take by mouth.   Tretinoin (RETIN-A EX) Apply topically at bedtime.   triamcinolone (NASACORT) 55 MCG/ACT AERO nasal inhaler Place 2 sprays into the nose as needed.    XIIDRA 5 % SOLN Apply 1 drop to eye every morning.   Social History   Tobacco Use   Smoking status: Never   Smokeless  tobacco: Never  Substance Use Topics   Alcohol use: Yes    Comment: 2 drinks a week    Family History  Problem Relation Age of Onset   Hypertension Mother    Parkinson's disease Father    Hyperlipidemia Father    Mental illness Father    Heart disease Maternal Grandmother    Asthma Maternal Grandmother    Diabetes Maternal Grandfather    Heart disease Maternal Grandfather    Anxiety disorder Sister    Bipolar disorder Brother    Heart disease Paternal Grandfather      Review of Systems  Constitutional:  Negative for activity change, appetite change, chills, fatigue, fever and unexpected weight change.  HENT:  Negative for congestion, ear pain, hearing loss, sinus pressure, sinus pain, sore throat and trouble swallowing.   Eyes:  Negative for pain and visual disturbance.  Respiratory:  Negative for cough, chest tightness, shortness of breath and wheezing.   Cardiovascular:  Negative for chest pain, palpitations and leg swelling.  Gastrointestinal:  Negative for abdominal pain, blood in stool, constipation, diarrhea, nausea and vomiting.  Genitourinary:  Negative for difficulty urinating and menstrual problem.  Musculoskeletal:  Negative for arthralgias and back pain.  Skin:  Negative for rash.  Neurological:  Negative for dizziness, weakness, numbness and headaches.  Hematological:  Negative for adenopathy. Does not bruise/bleed easily.  Psychiatric/Behavioral:  Negative for sleep disturbance and suicidal ideas. The patient is not nervous/anxious.    CBC:  Lab Results  Component Value Date   WBC 7.1 10/26/2019   HGB 12.1 10/26/2019   HCT 36.3 10/26/2019   MCH 30.4 10/26/2019   MCHC 33.3 10/26/2019   RDW 12.0 10/26/2019   PLT 222 10/26/2019   MPV 11.7 10/26/2019   CMP: Lab Results  Component Value Date   NA 138 10/26/2019   K 4.1 10/26/2019   CL 103 10/26/2019   CO2 26 10/26/2019   GLUCOSE 97 10/26/2019   BUN 12 10/26/2019   CREATININE 0.88 10/26/2019   CALCIUM  9.1 10/26/2019   PROT 6.2 10/26/2019   BILITOT 0.4 10/26/2019   ALKPHOS 40 10/31/2018   ALT 9 10/26/2019   AST 12 10/26/2019   LIPID: Lab Results  Component Value Date   CHOL 208 (H) 10/26/2019   TRIG 63 10/26/2019   HDL 71 10/26/2019   LDLCALC 122 (H) 10/26/2019    Objective:  BP 100/62 (BP Location: Left Arm, Patient Position: Sitting, Cuff Size: Normal)   Pulse 63   Temp 97.9 F (36.6 C) (Oral)   Ht 5\' 3"  (1.6 m)   Wt 129 lb 8 oz (58.7 kg)   SpO2 98%   BMI 22.94 kg/m   Weight: 129 lb 8 oz (58.7 kg)   BP Readings from Last 3 Encounters:  12/10/20 100/62  08/27/20 106/68  11/06/19 102/68   Wt Readings from Last 3 Encounters:  12/10/20 129 lb 8 oz (58.7 kg)  08/27/20 130 lb (59 kg)  11/06/19 130 lb (59 kg)    Physical Exam Constitutional:      General: She is not in acute distress.    Appearance: She is well-developed.  HENT:     Head: Normocephalic and atraumatic.     Right Ear: External ear normal.     Left Ear: External ear normal.     Mouth/Throat:     Pharynx: No oropharyngeal exudate.  Eyes:     Conjunctiva/sclera: Conjunctivae normal.     Pupils: Pupils are equal, round, and reactive to light.  Neck:     Thyroid: No thyromegaly.  Cardiovascular:     Rate and Rhythm: Normal rate and regular rhythm.     Heart sounds: Normal heart sounds. No murmur heard.   No friction rub. No gallop.  Pulmonary:     Effort: Pulmonary effort is normal.     Breath sounds: Normal breath sounds.  Abdominal:     General: Bowel sounds are normal. There is no distension.     Palpations: Abdomen is soft. There is no mass.     Tenderness: There is no abdominal tenderness. There is no guarding.     Hernia: No hernia is present.  Musculoskeletal:        General: No tenderness or deformity. Normal range of motion.     Cervical back: Normal range of motion and neck supple.  Lymphadenopathy:     Cervical: No cervical adenopathy.  Skin:    General: Skin is warm and dry.      Findings: No rash.  Neurological:     Mental Status: She is alert and oriented to person, place, and time.     Deep Tendon Reflexes: Reflexes normal.     Reflex Scores:      Tricep reflexes are 2+ on the right side and 2+ on the left side.  Bicep reflexes are 2+ on the right side and 2+ on the left side.      Brachioradialis reflexes are 2+ on the right side and 2+ on the left side.      Patellar reflexes are 2+ on the right side and 2+ on the left side. Psychiatric:        Speech: Speech normal.        Behavior: Behavior normal.        Thought Content: Thought content normal.    Assessment/Plan: There are no preventive care reminders to display for this patient.  Health Maintenance reviewed - she is up to date with HM.  1. Preventative health care Keep up with healthy lifestyle and healthy eating.   2. Flu vaccine need - Flu Vaccine QUAD 6+ mos PF IM (Fluarix Quad PF)  3. B12 deficiency - Vitamin B12; Future - Folate; Future  4. Iron deficiency - CBC with Differential/Platelet; Future  5. Lipid screening - Lipid panel; Future  6. Screening for diabetes mellitus - Comprehensive metabolic panel; Future  7. Vitamin D deficiency - VITAMIN D 25 Hydroxy (Vit-D Deficiency, Fractures); Future    Return in about 1 year (around 12/10/2021) for physical exam.  Micheline Rough, MD

## 2020-12-17 ENCOUNTER — Encounter: Payer: Self-pay | Admitting: Family Medicine

## 2021-06-22 ENCOUNTER — Other Ambulatory Visit: Payer: Self-pay | Admitting: Obstetrics & Gynecology

## 2021-06-22 DIAGNOSIS — Z1231 Encounter for screening mammogram for malignant neoplasm of breast: Secondary | ICD-10-CM

## 2021-06-23 ENCOUNTER — Ambulatory Visit
Admission: RE | Admit: 2021-06-23 | Discharge: 2021-06-23 | Disposition: A | Discharge status: Home / Self Care | Payer: PRIVATE HEALTH INSURANCE | Source: Ambulatory Visit | Attending: Obstetrics & Gynecology | Admitting: Obstetrics & Gynecology

## 2021-06-23 DIAGNOSIS — Z1231 Encounter for screening mammogram for malignant neoplasm of breast: Secondary | ICD-10-CM

## 2021-08-28 ENCOUNTER — Ambulatory Visit: Payer: No Typology Code available for payment source | Admitting: Obstetrics & Gynecology

## 2021-08-28 DIAGNOSIS — Z0289 Encounter for other administrative examinations: Secondary | ICD-10-CM

## 2021-09-22 ENCOUNTER — Ambulatory Visit: Payer: Self-pay | Admitting: Obstetrics & Gynecology

## 2021-10-19 ENCOUNTER — Ambulatory Visit (INDEPENDENT_AMBULATORY_CARE_PROVIDER_SITE_OTHER): Payer: PRIVATE HEALTH INSURANCE | Admitting: Obstetrics & Gynecology

## 2021-10-19 ENCOUNTER — Encounter: Payer: Self-pay | Admitting: Obstetrics & Gynecology

## 2021-10-19 VITALS — BP 110/78 | Ht 63.0 in | Wt 132.0 lb

## 2021-10-19 DIAGNOSIS — Z01419 Encounter for gynecological examination (general) (routine) without abnormal findings: Secondary | ICD-10-CM | POA: Diagnosis not present

## 2021-10-19 DIAGNOSIS — Z3009 Encounter for other general counseling and advice on contraception: Secondary | ICD-10-CM

## 2021-10-19 DIAGNOSIS — N926 Irregular menstruation, unspecified: Secondary | ICD-10-CM | POA: Diagnosis not present

## 2021-10-19 NOTE — Progress Notes (Signed)
Bethany Oliver 05/20/1970 [id number]   History:    51 y.o. G1P0A1 Married.  Pharmacist at [employer].  Adopted daughter 56 yo.   RP:  Established patient presenting for annual gyn exam   HPI: Menstrual periods more irregular mostly every 17-34 days, spaced x 3 months from 03/2021 to 06/2021.  No pelvic pain.  Reports increased headaches post menses, controlled with ibuprofen. No pain with intercourse. Remote h/o LEEP >20 yrs ago.  Paps normal since then. Pap Neg 08/2020.  Will repeat Pap next year. Urine and bowel movements normal. Colono 11/2020. Breasts normal.  Stable small right breast fibroadenoma, not felt by patient.  Mammo Negative 06/2021.  Body mass index 23.38.  Running on a regular basis. Healthy nutrition.  Health labs with family physician.  Past medical history,surgical history, family history and social history were all reviewed and documented in the EPIC chart.  Gynecologic History Patient's last menstrual period was 09/29/2021.  Obstetric History OB History  Gravida Para Term Preterm AB Living  1 0     1 0  SAB IAB Ectopic Multiple Live Births  1            # Outcome Date GA Lbr Len/2nd Weight Sex Delivery Anes PTL Lv  1 SAB              ROS: A ROS was performed and pertinent positives and negatives are included in the history.  GENERAL: No fevers or chills. HEENT: No change in vision, no earache, sore throat or sinus congestion. NECK: No pain or stiffness. CARDIOVASCULAR: No chest pain or pressure. No palpitations. PULMONARY: No shortness of breath, cough or wheeze. GASTROINTESTINAL: No abdominal pain, nausea, vomiting or diarrhea, melena or bright red blood per rectum. GENITOURINARY: No urinary frequency, urgency, hesitancy or dysuria. MUSCULOSKELETAL: No joint or muscle pain, no back pain, no recent trauma. DERMATOLOGIC: No rash, no itching, no lesions. ENDOCRINE: No polyuria, polydipsia, no heat or cold intolerance. No recent change in weight.  HEMATOLOGICAL: No anemia or easy bruising or bleeding. NEUROLOGIC: No headache, seizures, numbness, tingling or weakness. PSYCHIATRIC: No depression, no loss of interest in normal activity or change in sleep pattern.     Exam:   BP 110/78 (BP Location: Right Arm, Patient Position: Sitting, Cuff Size: Normal)   Ht '5\' 3"'$  (1.6 m)   Wt 132 lb (59.9 kg)   LMP 09/29/2021   BMI 23.38 kg/m   Body mass index is 23.38 kg/m.  General appearance : Well developed well nourished female. No acute distress HEENT: Eyes: no retinal hemorrhage or exudates,  Neck supple, trachea midline, no carotid bruits, no thyroidmegaly Lungs: Clear to auscultation, no rhonchi or wheezes, or rib retractions  Heart: Regular rate and rhythm, no murmurs or gallops Breast:Examined in sitting and supine position were symmetrical in appearance, no palpable masses or tenderness,  no skin retraction, no nipple inversion, no nipple discharge, no skin discoloration, no axillary or supraclavicular lymphadenopathy Abdomen: no palpable masses or tenderness, no rebound or guarding Extremities: no edema or skin discoloration or tenderness  Pelvic: Vulva: Normal             Vagina: No gross lesions or discharge  Cervix: No gross lesions or discharge  Uterus  AV, normal size, shape and consistency, non-tender and mobile  Adnexa  Without masses or tenderness  Anus: Normal   Assessment/Plan:  51 y.o. female for annual exam   1. Well female exam with routine gynecological exam Menstrual periods  more irregular mostly every 17-34 days, spaced x 3 months from 03/2021 to 06/2021.  No pelvic pain.  Reports increased headaches post menses, controlled with ibuprofen. No pain with intercourse. Remote h/o LEEP >20 yrs ago.  Paps normal since then. Pap Neg 08/2020.  Will repeat Pap next year. Urine and bowel movements normal. Colono 11/2020. Breasts normal.  Stable small right breast fibroadenoma, not felt by patient.  Mammo Negative 06/2021.  Body  mass index 23.38.  Running on a regular basis. Healthy nutrition.  Health labs with family physician.  2. Irregular menses Menstrual periods more irregular mostly every 17-34 days, spaced x 3 months from 03/2021 to 06/2021.  No pelvic pain. Probably entering perimenopause, but will r/o endometrial pathology with a Pelvic US, possible EBx. - US Transvaginal Non-OB; Future  3. Encounter for other general counseling or advice on contraception  H/O primary Infertility.  Princess Bruins MD, 11:45 AM 10/19/2021

## 2021-10-29 ENCOUNTER — Ambulatory Visit (INDEPENDENT_AMBULATORY_CARE_PROVIDER_SITE_OTHER): Payer: PRIVATE HEALTH INSURANCE | Admitting: Sports Medicine

## 2021-10-29 ENCOUNTER — Ambulatory Visit
Admission: RE | Admit: 2021-10-29 | Discharge: 2021-10-29 | Disposition: A | Discharge status: Home / Self Care | Payer: PRIVATE HEALTH INSURANCE | Source: Ambulatory Visit | Attending: Sports Medicine | Admitting: Sports Medicine

## 2021-10-29 VITALS — BP 118/66 | Ht 64.0 in | Wt 132.0 lb

## 2021-10-29 DIAGNOSIS — M25562 Pain in left knee: Secondary | ICD-10-CM

## 2021-10-29 NOTE — Progress Notes (Signed)
Bethany Oliver - 51 y.o. female MRN [id number]  Date of birth: Sep 20, 1970    CHIEF COMPLAINT:   L Leg numbness    SUBJECTIVE:   HPI:  Is a pleasant 51 year old female here for evaluation of left hip/back/leg pain.  She was previously seen here in 2021 for same complaint.  At that time she was prescribed physical therapy and after several months of that her symptoms mostly resolved.  Unfortunately over the last 6 months they have now returned.  She is a runner and says with running she gets left leg soreness/numbness/tingling all the way down the outside of the leg into the foot.  The pain is sharp and starts at the top of the hip and radiates down the leg.  She is unsure if the whole foot goes numb or if is just the top.  This is unfortunately caused her to stop running for the last 3 weeks.  Overall, he says her leg just never quite feels right.  She has tried taking Aleve as needed for pain which only helps a little bit.  Denies any issues in the right lower extremity.  ROS:     See HPI  PERTINENT  PMH / PSH FH / / SH:  Past Medical, Surgical, Social, and Family History Reviewed & Updated in the EMR.  Pertinent findings include:  none  OBJECTIVE: BP 118/66   Ht '5\' 4"'$  (1.626 m)   Wt 132 lb (59.9 kg)   LMP 09/29/2021   BMI 22.66 kg/m   Physical Exam:  Vital signs are reviewed.  GEN: Alert and oriented, NAD Pulm: Breathing unlabored PSY: normal mood, congruent affect  MSK: No obvious misalignment of lumbar spine.  No overlying erythema.  Nontender to palpation lumbar spinous processes, no bony step-off.  Mildly tender to palpation left lower lumbar paraspinal musculature. Full range of motion in forward flexion, extension, side bending, twisting without pain. Full ROM bilateral ankles with symmetric strength 5/5  4/5 strength L great toe extension, 5/5 R great toe extension Negative FABER left hip.  Negative FADIR left hip. Negative straight leg raise bilaterally Sensation intact  to light touch medial and lateral aspects of lower extremities, and lateral, dorsal, and medial aspects of foot.  Reflexes: Patellar (L4), and Ankle (S1) 2+ Bilaterally   ASSESSMENT & PLAN:  1. Left L5 Radiculopathy -Patient has unfortunately continued to have radicular type symptoms, most consistent with pathology associated with L5 dermatome.  It has now been present for over 2 years and not gotten better with formal physical therapy.  At this point we need to obtain more diagnostic studies of the lumbar spine.  We will start with obtaining x-rays of lumbar spine followed by MRI of lumbar spine without contrast.  She will continue stretches she learned from physical therapy and oral anti-inflammatories as needed.  She will also try crosstraining with activities like biking. We will follow-up with her by telephone discuss MRI results and next steps.  She may benefit from a therapeutic/diagnostic lumbar epidural corticosteroid injection in the future to see if this will allow her to return to running.  All questions were answered she agrees to plan.   Dortha Kern, MD PGY-4, Sports Medicine Fellow [facility]  Patient seen and evaluated with the sports medicine fellow.  I agree with the above plan of care.  This is a chronic issue for Bethany Oliver.  Her physical exam still shows some weakness with resisted left great toe extension.  She  also endorses weakness in this leg when trying to run.  She had physical therapy in the past with overall improvement but she has been unable to return to running without pain.  I recommended that we get some imaging starting with x-rays of her lumbar spine.  We will also go ahead and proceed with an MRI specifically to rule out a bulging lumbar disc or foraminal stenosis on the left that may be impinging the L5 nerve root.  Phone follow-up with those results when available.  I did discuss the possibility of a diagnostic/therapeutic lumbar ESI if  the MRI confirms that stenosis is the issue.  She may ultimately have to stick with nonimpact exercise such as spinning and swimming since her symptoms seem to currently be limited to running.  This note was dictated using Dragon naturally speaking software and may contain errors in syntax, spelling, or content which have not been identified prior to signing this note.   Addendum: X-ray reviewed.  There is some mild disc space narrowing at L4-L5 and L5-S1.  Also some lower lumbar spine facet degenerative changes.  Patient will proceed with MRI scan as scheduled to rule out a bulging lumbar disc which may be encroaching upon or compressing the left L5 nerve root.

## 2021-11-10 ENCOUNTER — Ambulatory Visit (INDEPENDENT_AMBULATORY_CARE_PROVIDER_SITE_OTHER): Payer: Self-pay | Admitting: Sports Medicine

## 2021-11-10 VITALS — Ht 64.0 in

## 2021-11-10 DIAGNOSIS — M722 Plantar fascial fibromatosis: Secondary | ICD-10-CM

## 2021-11-11 ENCOUNTER — Ambulatory Visit
Admission: RE | Admit: 2021-11-11 | Discharge: 2021-11-11 | Disposition: A | Discharge status: Home / Self Care | Payer: PRIVATE HEALTH INSURANCE | Source: Ambulatory Visit | Attending: Sports Medicine | Admitting: Sports Medicine

## 2021-11-11 DIAGNOSIS — M25562 Pain in left knee: Secondary | ICD-10-CM

## 2021-11-11 NOTE — Progress Notes (Signed)
Patient ID: Bethany Oliver, female   DOB: 12-Dec-1970, 51 y.o.   MRN: [id number]  Bethany Oliver presents today for her first soundwave treatment for left foot Planter fasciitis.  We have discussed starting this at her last office visit.  Treatment performed as below.  She will follow-up next week for her second treatment.  She is scheduled to undergo an MRI of her lumbar spine tomorrow and we will plan on discussing those findings at her follow-up visit next week.   Procedure: ECSWT Indications: Planter fasciitis   Procedure Details Consent: Risks of procedure as well as the alternatives and risks of each were explained to the patient.  Written consent for procedure obtained. Time Out: Verified patient identification, verified procedure, site was marked, verified correct patient position, medications/allergies/relevent history reviewed.  The area was cleaned with alcohol swab.     The left plantar fascia was targeted for Extracorporeal shockwave therapy.    Preset: Planter fasciitis Power Level: 90 Frequency: 10 Impulse/cycles: 2500 Head size: Large   Patient tolerated procedure well without immediate complications

## 2021-11-17 ENCOUNTER — Ambulatory Visit (INDEPENDENT_AMBULATORY_CARE_PROVIDER_SITE_OTHER): Payer: Self-pay | Admitting: Sports Medicine

## 2021-11-17 VITALS — Ht 64.0 in

## 2021-11-17 DIAGNOSIS — M722 Plantar fascial fibromatosis: Secondary | ICD-10-CM

## 2021-11-18 NOTE — Progress Notes (Signed)
Patient ID: Bethany Oliver, female   DOB: June 20, 1970, 51 y.o.   MRN: [id number]  Jaklyn presents today for her second soundwave treatment for her left foot Planter fasciitis.  She has already noticed benefit after the first treatment.  She is also here today to discuss MRI findings of her lumbar spine.  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  This is likely the pain generator for her left leg sciatica.  I discussed treatment options including a return to formal physical therapy versus a lumbar ESI.  At this point in time she would like to restart physical therapy but she would like to do this through a [facility] facility.  She will contact us with the name of the therapist that she would like to work with and I will make a referral to that location.  If she decides at a later date to pursue the lumbar ESI, she will simply call the office and we can order that at [place].  Second soundwave treatment for her left foot Planter fasciitis was performed as below.  She tolerates this well.  She is doing so well at this point that she would like to just wait on further treatments for now.  Follow-up as needed.  Procedure: ECSWT Indications: Left foot Planter fasciitis   Procedure Details Consent: Risks of procedure as well as the alternatives and risks of each were explained to the patient.  Written consent for procedure obtained. Time Out: Verified patient identification, verified procedure, site was marked, verified correct patient position, medications/allergies/relevent history reviewed.  The area was cleaned with alcohol swab.     The left plantar fascia was targeted for Extracorporeal shockwave therapy.    Preset: Planter fasciitis Power Level: 90 Frequency: 10 Impulse/cycles: 2500 Head size: Large   Patient tolerated procedure well without immediate complications

## 2021-11-26 ENCOUNTER — Other Ambulatory Visit: Payer: PRIVATE HEALTH INSURANCE | Admitting: Obstetrics & Gynecology

## 2021-11-26 ENCOUNTER — Other Ambulatory Visit: Payer: PRIVATE HEALTH INSURANCE

## 2021-12-03 ENCOUNTER — Encounter: Payer: Self-pay | Admitting: Sports Medicine

## 2021-12-11 ENCOUNTER — Other Ambulatory Visit: Payer: Self-pay | Admitting: *Deleted

## 2021-12-11 DIAGNOSIS — M722 Plantar fascial fibromatosis: Secondary | ICD-10-CM

## 2021-12-24 ENCOUNTER — Encounter: Payer: Self-pay | Admitting: Obstetrics & Gynecology

## 2021-12-24 ENCOUNTER — Ambulatory Visit (INDEPENDENT_AMBULATORY_CARE_PROVIDER_SITE_OTHER): Payer: PRIVATE HEALTH INSURANCE

## 2021-12-24 ENCOUNTER — Ambulatory Visit (INDEPENDENT_AMBULATORY_CARE_PROVIDER_SITE_OTHER): Payer: PRIVATE HEALTH INSURANCE | Admitting: Obstetrics & Gynecology

## 2021-12-24 VITALS — BP 118/78 | HR 56

## 2021-12-24 DIAGNOSIS — N926 Irregular menstruation, unspecified: Secondary | ICD-10-CM | POA: Diagnosis not present

## 2021-12-24 NOTE — Progress Notes (Signed)
    Bethany Oliver 05/25/1970 [id number]        51 y.o.  G1P0010 Married.  Adopted daughter is 79 yo.  RP: Irregular menses for Pelvic US  HPI: Patient seen for annual gyn exam on 10/19/21:   Menstrual periods more irregular mostly every 17-34 days, spaced x 3 months from 03/2021 to 06/2021.  LMP 12/14/21.  No pelvic pain.  Reports increased headaches post menses, controlled with ibuprofen. No pain with intercourse.    OB History  Gravida Para Term Preterm AB Living  1 0     1 0  SAB IAB Ectopic Multiple Live Births  1            # Outcome Date GA Lbr Len/2nd Weight Sex Delivery Anes PTL Lv  1 SAB             Past medical history,surgical history, problem list, medications, allergies, family history and social history were all reviewed and documented in the EPIC chart.   Directed ROS with pertinent positives and negatives documented in the history of present illness/assessment and plan.  Exam:  Vitals:   12/24/21 1518  BP: 118/78  Pulse: (!) 56  SpO2: 99%   General appearance:  Normal  Pelvic US today: Comparison is made with previous scan 10/11/19.  T/V images.  Anteverted uterus with two fibroids slightly larger than on the previous scan.  The overall uterine size is measured at 6.55 x 6.1 x 4.01 cm.  The largest fibroid is 3.6 x 2.7 cm and the smaller one is 1.5 x 1.66 cm. The endometrial line is measured at 5.0 mm, but the contour is distorted by the central fibroid.  Irregular cervical canal with a suspected mass 6 x 3 mm with a feeder vessel identified to this area.  Both ovaries are small with sparse follicles.  Previously seen right ovarian cyst is no longer present.  No adnexal mass.  No free fluid.   Assessment/Plan:  51 y.o. G1P0010   1. Irregular menses Patient seen for annual gyn exam on 10/19/21:   Menstrual periods more irregular mostly every 17-34 days, spaced x 3 months from 03/2021 to 06/2021.  LMP 12/14/21.  No pelvic pain.  Reports increased headaches post  menses, controlled with ibuprofen. No pain with intercourse.  Pelvic US findings thoroughly reviewed.  The endometrial line is 5.0 mm, but the contour is distorted by a central fibroid.  Irregular cervical canal with a mass 6 x 3 mm with a feeder vessel, probable Polyp.  Decision to proceed with a Sonohysto/possible EBx at f/u. Sonohysto procedure reviewed.  The possibility of needing a HSC/Myosure excision/D+C discussed. Patient voiced understanding and agreement with the plan. - [place] Sonohysterogram; Future  Other orders - azithromycin (ZITHROMAX) 250 MG tablet; Take two tablets on the first day and then one tablet every day after. (Patient not taking: Reported on 12/24/2021) - Cholecalciferol (VITAMIN D-1000 MAX ST) 25 MCG (1000 UT) tablet; Take by mouth. - cyanocobalamin (VITAMIN B12) 1000 MCG tablet; Take 1 tablet by mouth daily. - Krill Oil (OMEGA-3) 500 MG CAPS; Take by mouth. - TYRVAYA 0.03 MG/ACT SOLN; Place into both nostrils.   Princess Bruins MD, 3:24 PM 12/24/2021

## 2022-02-04 ENCOUNTER — Ambulatory Visit (INDEPENDENT_AMBULATORY_CARE_PROVIDER_SITE_OTHER): Payer: PRIVATE HEALTH INSURANCE

## 2022-02-04 ENCOUNTER — Ambulatory Visit (INDEPENDENT_AMBULATORY_CARE_PROVIDER_SITE_OTHER): Payer: PRIVATE HEALTH INSURANCE | Admitting: Obstetrics & Gynecology

## 2022-02-04 ENCOUNTER — Encounter: Payer: Self-pay | Admitting: Obstetrics & Gynecology

## 2022-02-04 ENCOUNTER — Other Ambulatory Visit: Payer: Self-pay | Admitting: Obstetrics & Gynecology

## 2022-02-04 ENCOUNTER — Other Ambulatory Visit (HOSPITAL_COMMUNITY)
Admission: RE | Admit: 2022-02-04 | Discharge: 2022-02-04 | Disposition: A | Discharge status: Home / Self Care | Payer: PRIVATE HEALTH INSURANCE | Source: Ambulatory Visit | Attending: Obstetrics & Gynecology | Admitting: Obstetrics & Gynecology

## 2022-02-04 VITALS — BP 108/74 | HR 63

## 2022-02-04 DIAGNOSIS — N926 Irregular menstruation, unspecified: Secondary | ICD-10-CM

## 2022-02-04 DIAGNOSIS — D219 Benign neoplasm of connective and other soft tissue, unspecified: Secondary | ICD-10-CM | POA: Diagnosis not present

## 2022-02-04 DIAGNOSIS — N841 Polyp of cervix uteri: Secondary | ICD-10-CM | POA: Insufficient documentation

## 2022-02-04 DIAGNOSIS — Z01812 Encounter for preprocedural laboratory examination: Secondary | ICD-10-CM

## 2022-02-04 LAB — PREGNANCY, URINE: Preg Test, Ur: NEGATIVE

## 2022-02-04 NOTE — Progress Notes (Signed)
Bethany Oliver 20-Nov-1970 [id number]        51 y.o.  G1P0010   RP: Irregular menses/BTB with Endometrial Polyp and Uterine Fibroids for Sonohysto  HPI: Menstrual periods more irregular mostly every 17-34 days, spaced x 3 months from 03/2021 to 06/2021.  Pelvic US 12/24/21 showed the endometrial line at 5.0 mm with the contour distorted by the central fibroid. Irregular cervical canal with a suspected mass 6 x 3 mm with a feeder vessel identified to this area.    OB History  Gravida Para Term Preterm AB Living  1 0     1 0  SAB IAB Ectopic Multiple Live Births  1            # Outcome Date GA Lbr Len/2nd Weight Sex Delivery Anes PTL Lv  1 SAB             Past medical history,surgical history, problem list, medications, allergies, family history and social history were all reviewed and documented in the EPIC chart.   Directed ROS with pertinent positives and negatives documented in the history of present illness/assessment and plan.  Exam:  There were no vitals filed for this visit. General appearance:  Normal                                                                    Sono Infusion Hysterogram ( procedure note)   The initial transvaginal ultrasound demonstrated the following:  Comparison is made with previous scan on December 24, 2021.  Anteverted uterus with 2 central fibroids again noted distorting the endometrial cavity.  6 x 3 mm echogenic mass again seen in the cervical canal with feeder vessel.  Right ovary today with 2 cystic structures (at mid-cycle), a simple follicle measured at 1.2 cm and an avascular cyst with debris measured at 3.2 cm.  Left ovary is small with sparse follicles.   The speculum  was inserted and the cervix cleansed with Betadine solution after confirming that patient has no allergies.A small sonohysterography catheterwas utilized.  Insertion was facilitated with ring forceps, using a spear-like motion the catheter was inserted to the fundus of  the uterus. The speculum is then removed carefully to avoid dislodging the catheter. The catheter was flushed with sterile saline delete prior to insertion to rid it of small amounts of air.the sterile saline solution was infused into the uterine cavity as a vaginal ultrasound probe was then placed in the vagina for full visualization of the uterine cavity from a transvaginal approach. The following was noted:  No intracavitary mass in the uterus.  Thin combined endometrial walls at 5.18 mm.  Confirmation of an echogenic mass in the endocervical canal.  The catheter was then removed after retrieving some of the saline from the intrauterine cavity. An endometrial biopsy not done.  Endocervical Polyp excision under US guidance.  Patient tolerated procedure well. She had received a tablet of Aleve for discomfort.   Specimen sent to pathology. UPT Neg   Assessment/Plan:  51 y.o. G1P0010   1. Irregular menses Menstrual periods more irregular mostly every 17-34 days, spaced x 3 months from 03/2021 to 06/2021.  Pelvic US 12/24/21 showed the endometrial line at 5.0 mm with the contour distorted by the central  fibroid. Irregular cervical canal with a suspected mass 6 x 3 mm with a feeder vessel identified to this area.  Sonohysto today showing a thin normal endometrial combined line at 5.18 mm.  An endocervical Polyp at 6 mm is still present.  The Endocervical Polyp was excised with a small clamp and sent to pathology.  Endovaginal [place] after showed a normal endocervical canal.  Patient reassured.  Will observe the menstrual pattern.  Abnormal uterine bleeding precautions reviewed. - Surgical pathology( [place]/ [facility])  2. Endocervical polyp Excised today.  Patho pending. - Surgical pathology( [place]/ [facility])  3. Fibroids Stable, no SM component.  4. Encounter for preprocedural laboratory examination UPT Neg - Pregnancy, urine   Princess Bruins MD, 11:54 AM 02/04/2022

## 2022-02-08 LAB — SURGICAL PATHOLOGY

## 2022-05-25 ENCOUNTER — Other Ambulatory Visit: Payer: Self-pay | Admitting: Obstetrics & Gynecology

## 2022-05-25 DIAGNOSIS — Z1231 Encounter for screening mammogram for malignant neoplasm of breast: Secondary | ICD-10-CM

## 2022-07-07 ENCOUNTER — Ambulatory Visit
Admission: RE | Admit: 2022-07-07 | Discharge: 2022-07-07 | Disposition: A | Discharge status: Home / Self Care | Payer: PRIVATE HEALTH INSURANCE | Source: Ambulatory Visit | Attending: Obstetrics & Gynecology | Admitting: Obstetrics & Gynecology

## 2022-07-07 DIAGNOSIS — Z1231 Encounter for screening mammogram for malignant neoplasm of breast: Secondary | ICD-10-CM

## 2022-10-21 ENCOUNTER — Ambulatory Visit: Payer: PRIVATE HEALTH INSURANCE | Admitting: Obstetrics & Gynecology

## 2022-10-21 DIAGNOSIS — D219 Benign neoplasm of connective and other soft tissue, unspecified: Secondary | ICD-10-CM

## 2022-10-21 DIAGNOSIS — Z01419 Encounter for gynecological examination (general) (routine) without abnormal findings: Secondary | ICD-10-CM

## 2022-10-21 DIAGNOSIS — N950 Postmenopausal bleeding: Secondary | ICD-10-CM

## 2022-11-23 ENCOUNTER — Other Ambulatory Visit (HOSPITAL_COMMUNITY)
Admission: RE | Admit: 2022-11-23 | Discharge: 2022-11-23 | Disposition: A | Discharge status: Home / Self Care | Payer: PRIVATE HEALTH INSURANCE | Source: Ambulatory Visit | Attending: Obstetrics and Gynecology | Admitting: Obstetrics and Gynecology

## 2022-11-23 ENCOUNTER — Ambulatory Visit (INDEPENDENT_AMBULATORY_CARE_PROVIDER_SITE_OTHER): Payer: PRIVATE HEALTH INSURANCE | Admitting: Obstetrics and Gynecology

## 2022-11-23 ENCOUNTER — Encounter: Payer: Self-pay | Admitting: Obstetrics and Gynecology

## 2022-11-23 VITALS — BP 114/64 | HR 70 | Ht 63.25 in | Wt 129.0 lb

## 2022-11-23 DIAGNOSIS — D500 Iron deficiency anemia secondary to blood loss (chronic): Secondary | ICD-10-CM

## 2022-11-23 DIAGNOSIS — R232 Flushing: Secondary | ICD-10-CM | POA: Diagnosis not present

## 2022-11-23 DIAGNOSIS — Z01419 Encounter for gynecological examination (general) (routine) without abnormal findings: Secondary | ICD-10-CM | POA: Insufficient documentation

## 2022-11-23 NOTE — Patient Instructions (Signed)
The vaginal ultrasound order has been placed, all you need to do is call the scheduling desk at [phone] to make the appointment at one of our outpatient imaging centers at your earliest convenience. This is to evaluate your uterus and ovaries.  The report will go directly to epic after.  PLEASE also call the office with the date you have the ultrasound scheduled, so we can schedule a follow-up to discuss results with you.  Dr. Karma Greaser

## 2022-11-23 NOTE — Progress Notes (Signed)
52 y.o. y.o. female here for annual exam. Reports she stopped her period in February then had two periods in July.  The first was on July 3rd and was a couple light days then she bled again on the 30th. She has known fibroids. Had SHG last year with removal of cervical polyp Strong family history of osteoporosis. Takes vit D not calcium, no fractures. Works out.  Patient's last menstrual period was 09/21/2022 (exact date). Period Duration (Days): 4-5 Period Pattern: Regular Menstrual Flow: Moderate Menstrual Control: Tampon Dysmenorrhea: (!) Moderate Dysmenorrhea Symptoms: Cramping, Diarrhea  Narrative & Impression   Comparison is made with previous scan 10/11/19.  T/V images.  Anteverted uterus with two fibroids slightly larger than on the previous scan.  The overall uterine size is measured at 6.55 x 6.1 x 4.01 cm.  The largest fibroid is 3.6 x 2.7 cm and the smaller one is 1.5 x 1.66 cm. The endometrial line is measured at 5.0 mm, but the contour is distorted by the central fibroid.  Irregular cervical canal with a suspected mass 6 x 3 mm with a feeder vessel identified to this area.  Both ovaries are small with sparse follicles.  Previously seen right ovarian cyst is no longer present.  No adnexal mass.  No free fluid.        Result History  US Transvaginal Non-OB (Order #[id number]) on 12/28/2021 - Order Result History Report  MyChart Results Release  MyChart Status: Active  Results Release   Encounter-Level Documents on 12/24/2021:  Electronic signature on 12/24/2021 2:13 PM - 1 of 3 e-signatures recorded      Order-Level Documents:  There are no order-level documents. Vitals  Height Weight BMI (Calculated)       Imaging  Imaging Information     Blood pressure 114/64, pulse 70, height 5' 3.25" (1.607 m), weight 129 lb (58.5 kg), last menstrual period 09/21/2022, SpO2 99%.     Component Value Date/Time   DIAGPAP  08/27/2020 1534    - Negative for  intraepithelial lesion or malignancy (NILM)   ADEQPAP  08/27/2020 1534    Satisfactory for evaluation; transformation zone component PRESENT.    GYN HISTORY:    Component Value Date/Time   DIAGPAP  08/27/2020 1534    - Negative for intraepithelial lesion or malignancy (NILM)   ADEQPAP  08/27/2020 1534    Satisfactory for evaluation; transformation zone component PRESENT.    OB History  Gravida Para Term Preterm AB Living  1 0     1 0  SAB IAB Ectopic Multiple Live Births  1            # Outcome Date GA Lbr Len/2nd Weight Sex Type Anes PTL Lv  1 SAB             Past Medical History:  Diagnosis Date   Allergy    Basal cell carcinoma    Depression    mild, no hospitalization   Dry eyes    Ocular rosacea    Rosacea    STD (sexually transmitted disease)    HSV 1   Stricture esophagus     Past Surgical History:  Procedure Laterality Date   IVF  2008-20012   LASIK  05/2011   LEEP  1999    Current Outpatient Medications on File Prior to Visit  Medication Sig Dispense Refill   buPROPion (WELLBUTRIN XL) 150 MG 24 hr tablet Take 1 tablet (150 mg total) by mouth daily. 90  tablet 3   cetirizine (ZYRTEC) 10 MG tablet Take 10 mg by mouth daily.     Cholecalciferol (VITAMIN D-1000 MAX ST) 25 MCG (1000 UT) tablet Take by mouth.     cyanocobalamin (VITAMIN B12) 1000 MCG tablet Take 1 tablet by mouth daily.     cycloSPORINE (RESTASIS) 0.05 % ophthalmic emulsion 1 drop 2 (two) times daily.     Ferrous Gluconate-C-Folic Acid (IRON-C PO)      Krill Oil (OMEGA-3) 500 MG CAPS Take by mouth.     omeprazole (PRILOSEC) 40 MG capsule Take 40 mg by mouth daily.     tretinoin (RETIN-A) 0.1 % cream Apply 1 Application topically at bedtime.     TYRVAYA 0.03 MG/ACT SOLN Place into both nostrils.     valACYclovir (VALTREX) 1000 MG tablet Take by mouth.     No current facility-administered medications on file prior to visit.    Social History   Socioeconomic History   Marital status:  Married    Spouse name: Not on file   Number of children: Not on file   Years of education: Not on file   Highest education level: Not on file  Occupational History   Not on file  Tobacco Use   Smoking status: Never   Smokeless tobacco: Never  Vaping Use   Vaping status: Never Used  Substance and Sexual Activity   Alcohol use: Yes    Comment: 2 drinks a week    Drug use: Never   Sexual activity: Yes    Partners: Male    Birth control/protection: None    Comment: 1st intercourse- 89, partners- more than 5  Other Topics Concern   Not on file  Social History Narrative   Work or School: works part time; Nurse, adult      Home Situation: lives with husband whom is Best boy and adopted child 2yo in 2015      Spiritual Beliefs: catholic      Lifestyle: does get regular CV exercise; diet is good            Social Determinants of Corporate investment banker Strain: Not on file  Food Insecurity: Not on file  Transportation Needs: Not on file  Physical Activity: Not on file  Stress: Not on file  Social Connections: Unknown (07/02/2021)   Received from [facility], [facility]   Social Network    Social Network: Not on file  Intimate Partner Violence: Unknown (05/25/2021)   Received from [employer], [facility]   HITS    Physically Hurt: Not on file    Insult or Talk Down To: Not on file    Threaten Physical Harm: Not on file    Scream or Curse: Not on file    Family History  Problem Relation Age of Onset   Hypertension Mother    Parkinson's disease Father    Hyperlipidemia Father    Mental illness Father    Stroke Father    Anxiety disorder Sister    Bipolar disorder Brother    Breast cancer Paternal Aunt 60   Heart disease Maternal Grandmother    Asthma Maternal Grandmother    Diabetes Maternal Grandfather    Heart disease Maternal Grandfather    Heart disease Paternal Grandfather      No Known Allergies    Patient's last  menstrual period was Patient's last menstrual period was 09/21/2022 (exact date)..            Review of Systems  Alls systems reviewed and are negative.     Physical Exam Vitals and nursing note reviewed. Exam conducted with a chaperone present.  Constitutional:      Appearance: Normal appearance.  HENT:     Head: Normocephalic.  Neck:     Thyroid: No thyroid mass, thyromegaly or thyroid tenderness.  Cardiovascular:     Rate and Rhythm: Normal rate and regular rhythm.  Pulmonary:     Effort: Pulmonary effort is normal.     Breath sounds: Normal breath sounds.  Chest:  Breasts:    Right: Normal. No swelling, bleeding, mass, nipple discharge, skin change or tenderness.     Left: Normal. No swelling, bleeding, mass, nipple discharge, skin change or tenderness.  Abdominal:     Palpations: Abdomen is soft.     Tenderness: There is no guarding or rebound.  Genitourinary:    General: Normal vulva.     Exam position: Knee-chest position.     Labia:        Right: No rash, tenderness or lesion.        Left: No rash, tenderness or lesion.      Urethra: No prolapse or urethral pain.     Vagina: Normal.     Cervix: Normal.     Uterus: Normal. Enlarged.      Adnexa: Right adnexa normal and left adnexa normal.     Comments: Irregular contour, fibroids Musculoskeletal:        General: Normal range of motion.     Cervical back: Full passive range of motion without pain and normal range of motion.     Right lower leg: No edema.     Left lower leg: No edema.  Skin:    General: Skin is warm.  Neurological:     Mental Status: She is alert.       A:         Well Woman GYN exam, fibroids, DUB                             P:        Pap smear collected             Encouraged annual mammogram screening             Colonoscopy UTD             Labs see orders             Encouraged calcium and vit D.  DXA at age 73 with mother's history. To get TV US to evaluate endometrial lining and  hormone panel to determine if in perimenopause.  Is having some mild hotflashes.  Discussed she may need an EMB based on these results and to return to discuss them and POC    Earley Favor

## 2022-11-24 ENCOUNTER — Other Ambulatory Visit: Payer: Self-pay

## 2022-11-24 DIAGNOSIS — N950 Postmenopausal bleeding: Secondary | ICD-10-CM

## 2022-11-24 LAB — VITAMIN D 25 HYDROXY (VIT D DEFICIENCY, FRACTURES): Vit D, 25-Hydroxy: 49 ng/mL (ref 30–100)

## 2022-11-24 LAB — FOLLICLE STIMULATING HORMONE: FSH: 140.1 m[IU]/mL — ABNORMAL HIGH

## 2022-11-24 LAB — CBC
HCT: 38.5 % (ref 35.0–45.0)
Hemoglobin: 12.6 g/dL (ref 11.7–15.5)
MCH: 30.2 pg (ref 27.0–33.0)
MCHC: 32.7 g/dL (ref 32.0–36.0)
MCV: 92.3 fL (ref 80.0–100.0)
MPV: 11.7 fL (ref 7.5–12.5)
Platelets: 237 10*3/uL (ref 140–400)
RBC: 4.17 10*6/uL (ref 3.80–5.10)
RDW: 11.8 % (ref 11.0–15.0)
WBC: 6.3 10*3/uL (ref 3.8–10.8)

## 2022-11-24 LAB — IRON,TIBC AND FERRITIN PANEL
%SAT: 22 % (ref 16–45)
Ferritin: 73 ng/mL (ref 16–232)
Iron: 70 ug/dL (ref 45–160)
TIBC: 314 ug/dL (ref 250–450)

## 2022-11-24 LAB — B12 AND FOLATE PANEL
Folate: 20.7 ng/mL
Vitamin B-12: 603 pg/mL (ref 200–1100)

## 2022-11-24 LAB — ESTRADIOL: Estradiol: 22 pg/mL

## 2022-11-24 LAB — TSH: TSH: 3.05 m[IU]/L

## 2022-11-29 LAB — CYTOLOGY - PAP
Adequacy: ABSENT
Comment: NEGATIVE
Diagnosis: UNDETERMINED — AB
High risk HPV: NEGATIVE

## 2022-12-01 ENCOUNTER — Other Ambulatory Visit: Payer: Self-pay | Admitting: *Deleted

## 2022-12-01 DIAGNOSIS — N950 Postmenopausal bleeding: Secondary | ICD-10-CM

## 2023-01-05 ENCOUNTER — Other Ambulatory Visit: Payer: PRIVATE HEALTH INSURANCE

## 2023-01-06 ENCOUNTER — Ambulatory Visit
Admission: RE | Admit: 2023-01-06 | Discharge: 2023-01-06 | Disposition: A | Discharge status: Home / Self Care | Payer: PRIVATE HEALTH INSURANCE | Source: Ambulatory Visit | Attending: Obstetrics and Gynecology | Admitting: Obstetrics and Gynecology

## 2023-01-06 DIAGNOSIS — N950 Postmenopausal bleeding: Secondary | ICD-10-CM

## 2023-01-06 DIAGNOSIS — D219 Benign neoplasm of connective and other soft tissue, unspecified: Secondary | ICD-10-CM

## 2023-01-10 ENCOUNTER — Encounter: Payer: Self-pay | Admitting: Obstetrics and Gynecology

## 2023-01-10 ENCOUNTER — Ambulatory Visit (INDEPENDENT_AMBULATORY_CARE_PROVIDER_SITE_OTHER): Payer: PRIVATE HEALTH INSURANCE | Admitting: Obstetrics and Gynecology

## 2023-01-10 VITALS — BP 110/72 | HR 75

## 2023-01-10 DIAGNOSIS — Z712 Person consulting for explanation of examination or test findings: Secondary | ICD-10-CM

## 2023-01-10 DIAGNOSIS — N950 Postmenopausal bleeding: Secondary | ICD-10-CM

## 2023-01-10 DIAGNOSIS — Z79890 Hormone replacement therapy (postmenopausal): Secondary | ICD-10-CM

## 2023-01-10 MED ORDER — COMBIPATCH 0.05-0.25 MG/DAY TD PTTW: 1.0000 | MEDICATED_PATCH | TRANSDERMAL | 12 refills | Status: DC

## 2023-01-10 NOTE — Progress Notes (Signed)
52 y.o. y.o. female here for f/u US results.  She has not bled since July. Reports she stopped her period in February then had two periods in July.  The first was on July 3rd and was a couple light days then she bled again on the 30th. She has known fibroids. Had SHG last year with removal of cervical polyp Strong family history of osteoporosis. Takes vit D not calcium, no fractures. Works out. She does report vasomotor symptoms that are bothersome to her. FSH and estradiol in menopause range in Oct.  No LMP recorded.    Narrative & Impression   Comparison is made with previous scan 10/11/19.  T/V images.  Anteverted uterus with two fibroids slightly larger than on the previous scan.  The overall uterine size is measured at 6.55 x 6.1 x 4.01 cm.  The largest fibroid is 3.6 x 2.7 cm and the smaller one is 1.5 x 1.66 cm. The endometrial line is measured at 5.0 mm, but the contour is distorted by the central fibroid.  Irregular cervical canal with a suspected mass 6 x 3 mm with a feeder vessel identified to this area.  Both ovaries are small with sparse follicles.  Previously seen right ovarian cyst is no longer present.  No adnexal mass.  No free fluid.        Result History  US Transvaginal Non-OB (Order #[id number]) on 12/28/2021 - Order Result History Report  MyChart Results Release  MyChart Status: Active  Results Release   Encounter-Level Documents on 12/24/2021:  Electronic signature on 12/24/2021 2:13 PM - 1 of 3 e-signatures recorded      Order-Level Documents:  There are no order-level documents. Vitals  Height Weight BMI (Calculated)       Imaging  Imaging Information     There were no vitals taken for this visit.     Component Value Date/Time   DIAGPAP (A) 11/23/2022 1529    - Atypical squamous cells of undetermined significance (ASC-US)   DIAGPAP  08/27/2020 1534    - Negative for intraepithelial lesion or malignancy (NILM)   HPVHIGH Negative  11/23/2022 1529   ADEQPAP  11/23/2022 1529    Satisfactory for evaluation; transformation zone component ABSENT.   ADEQPAP  08/27/2020 1534    Satisfactory for evaluation; transformation zone component PRESENT.    GYN HISTORY:    Component Value Date/Time   DIAGPAP (A) 11/23/2022 1529    - Atypical squamous cells of undetermined significance (ASC-US)   DIAGPAP  08/27/2020 1534    - Negative for intraepithelial lesion or malignancy (NILM)   HPVHIGH Negative 11/23/2022 1529   ADEQPAP  11/23/2022 1529    Satisfactory for evaluation; transformation zone component ABSENT.   ADEQPAP  08/27/2020 1534    Satisfactory for evaluation; transformation zone component PRESENT.    OB History  Gravida Para Term Preterm AB Living  1 0     1 0  SAB IAB Ectopic Multiple Live Births  1            # Outcome Date GA Lbr Len/2nd Weight Sex Type Anes PTL Lv  1 SAB             Past Medical History:  Diagnosis Date   Allergy    Basal cell carcinoma    Depression    mild, no hospitalization   Dry eyes    Ocular rosacea    Rosacea    STD (sexually transmitted disease)  HSV 1   Stricture esophagus     Past Surgical History:  Procedure Laterality Date   IVF  2008-20012   LASIK  05/2011   LEEP  1999    Current Outpatient Medications on File Prior to Visit  Medication Sig Dispense Refill   buPROPion (WELLBUTRIN XL) 150 MG 24 hr tablet Take 1 tablet (150 mg total) by mouth daily. 90 tablet 3   cetirizine (ZYRTEC) 10 MG tablet Take 10 mg by mouth daily.     Cholecalciferol (VITAMIN D-1000 MAX ST) 25 MCG (1000 UT) tablet Take by mouth.     cyanocobalamin (VITAMIN B12) 1000 MCG tablet Take 1 tablet by mouth daily.     cycloSPORINE (RESTASIS) 0.05 % ophthalmic emulsion 1 drop 2 (two) times daily.     Ferrous Gluconate-C-Folic Acid (IRON-C PO)      Krill Oil (OMEGA-3) 500 MG CAPS Take by mouth.     omeprazole (PRILOSEC) 40 MG capsule Take 40 mg by mouth daily.     tretinoin (RETIN-A) 0.1 %  cream Apply 1 Application topically at bedtime.     TYRVAYA 0.03 MG/ACT SOLN Place into both nostrils.     valACYclovir (VALTREX) 1000 MG tablet Take by mouth.     No current facility-administered medications on file prior to visit.    Social History   Socioeconomic History   Marital status: Married    Spouse name: Not on file   Number of children: Not on file   Years of education: Not on file   Highest education level: Not on file  Occupational History   Not on file  Tobacco Use   Smoking status: Never   Smokeless tobacco: Never  Vaping Use   Vaping status: Never Used  Substance and Sexual Activity   Alcohol use: Yes    Comment: 2 drinks a week    Drug use: Never   Sexual activity: Yes    Partners: Male    Birth control/protection: None    Comment: 1st intercourse- 2, partners- more than 5  Other Topics Concern   Not on file  Social History Narrative   Work or School: works part time; Nurse, adult      Home Situation: lives with husband whom is oncologist and adopted child 2yo in 2015      Spiritual Beliefs: catholic      Lifestyle: does get regular CV exercise; diet is good            Social Determinants of Corporate investment banker Strain: Not on file  Food Insecurity: Not on file  Transportation Needs: Not on file  Physical Activity: Not on file  Stress: Not on file  Social Connections: Unknown (07/02/2021)   Received [facility]   Social Network    Social Network: Not on file  Intimate Partner Violence: Unknown (05/25/2021)   Received from [facility], [facility]   HITS    Physically Hurt: Not on file    Insult or Talk Down To: Not on file    Threaten Physical Harm: Not on file    Scream or Curse: Not on file    Family History  Problem Relation Age of Onset   Hypertension Mother    Parkinson's disease Father    Hyperlipidemia Father    Mental illness Father    Stroke Father    Anxiety disorder Sister     Bipolar disorder Brother    Breast cancer Paternal Aunt 88  Heart disease Maternal Grandmother    Asthma Maternal Grandmother    Diabetes Maternal Grandfather    Heart disease Maternal Grandfather    Heart disease Paternal Grandfather      No Known Allergies    Patient's last menstrual period was No LMP recorded..            Review of Systems Alls systems reviewed and are negative.     Physical Exam Vitals and nursing note reviewed. Exam conducted with a chaperone present.  Constitutional:      Appearance: Normal appearance.  HENT:     Head: Normocephalic.  Neck:     Thyroid: No thyroid mass, thyromegaly or thyroid tenderness.  Cardiovascular:     Rate and Rhythm: Normal rate and regular rhythm.  Pulmonary:     Effort: Pulmonary effort is normal.     Breath sounds: Normal breath sounds.  Chest:  Breasts:    Right: Normal. No swelling, bleeding, mass, nipple discharge, skin change or tenderness.     Left: Normal. No swelling, bleeding, mass, nipple discharge, skin change or tenderness.  Abdominal:     Palpations: Abdomen is soft.     Tenderness: There is no guarding or rebound.  Genitourinary:    General: Normal vulva.     Exam position: Knee-chest position.     Labia:        Right: No rash, tenderness or lesion.        Left: No rash, tenderness or lesion.      Urethra: No prolapse or urethral pain.     Vagina: Normal.     Cervix: Normal.     Uterus: Normal. Enlarged.      Adnexa: Right adnexa normal and left adnexa normal.     Comments: Irregular contour, fibroids Musculoskeletal:        General: Normal range of motion.     Cervical back: Full passive range of motion without pain and normal range of motion.     Right lower leg: No edema.     Left lower leg: No edema.  Skin:    General: Skin is warm.  Neurological:     Mental Status: She is alert.    US PELVIC COMPLETE WITH TRANSVAGINAL (Accession [id number]) (Order [id number]) Imaging Date:  01/06/2023 Department: [facility] AT [address] Released By: Vanita Panda Authorizing: Earley Favor, MD   Exam Status  Status  Final [99]   PACS Intelerad Image Link   Show images for US PELVIC COMPLETE WITH TRANSVAGINAL Study Result  Narrative & Impression  CLINICAL DATA:  Postmenopausal bleeding.   EXAM: TRANSABDOMINAL AND TRANSVAGINAL ULTRASOUND OF PELVIS   TECHNIQUE: Both transabdominal and transvaginal ultrasound examinations of the pelvis were performed. Transabdominal technique was performed for global imaging of the pelvis including uterus, ovaries, adnexal regions, and pelvic cul-de-sac. It was necessary to proceed with endovaginal exam following the transabdominal exam to visualize the endometrium and ovaries.   COMPARISON:  February 04, 2022.   FINDINGS: Uterus   Measurements: 7.1 x 5.2 x 3.6 cm = volume: 70 mL. Two fibroids are noted in the fundus, the largest measuring 3.4 cm.   Endometrium   Thickness: 3 mm which is within normal limits. No focal abnormality visualized.   Right ovary   Measurements: 1.8 x 1.7 x 1.4 cm = volume: 2 mL. Normal appearance/no adnexal mass.   Left ovary   Measurements: 1.7 x 1.4 x 1.1 cm = volume: 1 mL. Normal appearance/no adnexal mass.  Other findings   No abnormal free fluid.   IMPRESSION: Endometrial thickness is measured at 3 mm. In the setting of post-menopausal bleeding, this is consistent with a benign etiology such as endometrial atrophy. If bleeding remains unresponsive to hormonal or medical therapy, sonohysterogram should be considered for focal lesion work-up. (Ref: Radiological Reasoning: Algorithmic Workup of Abnormal Vaginal Bleeding with Endovaginal Sonography and Sonohysterography. AJR 2008; 161:W96-04).   Two uterine fibroids are noted, the largest measuring 3.4 cm.     Electronically Signed   By: Lupita Raider M.D.   On: 01/10/2023 08:34       A:          [place] results , fibroids, DUB                             P:       TV US results printed and reviewed with patient. Lining 3mm and less than the 5mm cut off.  Low likelihood of endometrial cancer, but offered EMB.   She will return with any abnormal bleeding in the future. Counseled on the r/b/a/I of HRT use.  Discussed that she will need progesterone to avoid unopposed estrogen on the endometrial lining and risk for endometrial cancer. Discussed lower risk for DVT and stroke with the patch. Side effects include risk of breast tenderness and spotting along with low risk of blood clots and stroke with uncontrolled hypertension. Counseled on the benefits to help improve the bone density during menopause. The combipatch was sent to her pharmacy.  20 minutes spent on reviewing records, imaging,  and one on one patient time and counseling patient and documentation Dr. Judith Blonder

## 2023-02-08 ENCOUNTER — Encounter: Payer: Self-pay | Admitting: Obstetrics and Gynecology

## 2023-02-09 NOTE — Telephone Encounter (Signed)
AEX due in 11/2023 Currently on Combipatch 0.05-0.25mg /day dose.

## 2023-02-22 NOTE — Telephone Encounter (Signed)
 Msg sent to appt desk to schedule.  Left message for patient to call and schedule appointment.

## 2023-03-03 ENCOUNTER — Ambulatory Visit (INDEPENDENT_AMBULATORY_CARE_PROVIDER_SITE_OTHER): Payer: PRIVATE HEALTH INSURANCE | Admitting: Radiology

## 2023-03-03 VITALS — BP 102/68 | HR 70 | Ht 62.75 in | Wt 135.0 lb

## 2023-03-03 DIAGNOSIS — N951 Menopausal and female climacteric states: Secondary | ICD-10-CM

## 2023-03-03 DIAGNOSIS — N959 Unspecified menopausal and perimenopausal disorder: Secondary | ICD-10-CM

## 2023-03-03 MED ORDER — PROGESTERONE MICRONIZED 100 MG PO CAPS: 100.0000 mg | ORAL_CAPSULE | Freq: Every day | ORAL | 2 refills | Status: DC

## 2023-03-03 MED ORDER — ESTRADIOL 0.025 MG/24HR TD PTTW: 1.0000 | MEDICATED_PATCH | TRANSDERMAL | 2 refills | Status: DC

## 2023-03-03 NOTE — Progress Notes (Signed)
   FIDELIA CATHERS 1970-07-06 [id number]   History: Postmenopausal 53 y.o. presents for HRT consult. Was started on combipatch  this fall and stopped after 4 weeks due to weight gain. 6lb difference since last in office visit. Has occasional night sweats, otherwise doing well. Was interested in HRT for to protective benefits rather than managing symptoms at this time.   Gynecologic History Postmenopausal Last Pap: 10/24. Results were: ASCUS HPV neg Last mammogram: 5/23. Results were: normal   Obstetric History OB History  Gravida Para Term Preterm AB Living  1 0   1 0  SAB IAB Ectopic Multiple Live Births  1        # Outcome Date GA Lbr Len/2nd Weight Sex Type Anes PTL Lv  1 SAB              The following portions of the patient's history were reviewed and updated as appropriate: allergies, current medications, past family history, past medical history, past social history, past surgical history, and problem list.  Review of Systems Pertinent items noted in HPI and remainder of comprehensive ROS otherwise negative.  Past medical history, past surgical history, family history and social history were all reviewed and documented in the EPIC chart.  Exam:  Vitals:   03/03/23 1400  BP: 102/68  Pulse: 70  SpO2: 96%  Weight: 135 lb (61.2 kg)  Height: 5' 2.75 (1.594 m)   Body mass index is 24.11 kg/m.  Physical Exam Constitutional:      Appearance: Normal appearance. She is normal weight.  Pulmonary:     Effort: Pulmonary effort is normal.  Neurological:     Mental Status: She is alert.  Psychiatric:        Mood and Affect: Mood normal.        Thought Content: Thought content normal.        Judgment: Judgment normal.      Assessment/Plan:   1. Menopausal disorder (Primary) Risks and benefits reviewed. Will try patch and micronized progesterone  instead of combipatch  - estradiol  (VIVELLE -DOT) 0.025 MG/24HR; Place 1 patch onto the skin 2 (two) times a week.  Dispense: 8  patch; Refill: 2 - progesterone  (PROMETRIUM ) 100 MG capsule; Take 1 capsule (100 mg total) by mouth daily.  Dispense: 30 capsule; Refill: 2   Tykeshia Tourangeau B WHNP-BC, 2:23 PM 03/03/2023

## 2023-07-19 ENCOUNTER — Other Ambulatory Visit: Payer: Self-pay | Admitting: Nurse Practitioner

## 2023-07-19 DIAGNOSIS — N959 Unspecified menopausal and perimenopausal disorder: Secondary | ICD-10-CM

## 2023-07-19 MED ORDER — ESTRADIOL 0.025 MG/24HR TD PTTW: 1.0000 | MEDICATED_PATCH | TRANSDERMAL | 1 refills | Status: DC

## 2023-07-19 MED ORDER — PROGESTERONE MICRONIZED 100 MG PO CAPS: 100.0000 mg | ORAL_CAPSULE | Freq: Every day | ORAL | 1 refills | Status: DC

## 2023-10-11 ENCOUNTER — Other Ambulatory Visit: Payer: Self-pay | Admitting: Nurse Practitioner

## 2023-10-11 DIAGNOSIS — N959 Unspecified menopausal and perimenopausal disorder: Secondary | ICD-10-CM

## 2023-10-11 NOTE — Telephone Encounter (Signed)
 Med refill request:   1) Estradiol  0.025 mg/24 hr patch Last started on: 07/21/23 - #8 patches with one refill  2) Progesterone  (Prometrium ) 100 mg cap. Last filled on: 07/19/23 - #30 tablets with 1 refill Last AEX:  11/23/22 Next AEX: 12/06/23 Last Mammo: 06/23/21 Next AEX: Not yet scheduled Refill authorized? Please advise.

## 2023-12-06 ENCOUNTER — Ambulatory Visit: Payer: PRIVATE HEALTH INSURANCE | Admitting: Obstetrics and Gynecology

## 2023-12-13 ENCOUNTER — Other Ambulatory Visit: Payer: Self-pay | Admitting: Nurse Practitioner

## 2023-12-13 DIAGNOSIS — N959 Unspecified menopausal and perimenopausal disorder: Secondary | ICD-10-CM

## 2023-12-13 NOTE — Telephone Encounter (Signed)
 Med refill request: Vivelle   Last AEX: 11/23/22 Next AEX: 01/10/24 Last MMG (if hormonal med) 09/07/23 BIRADS Cat 1 neg  Refill authorized: last rx 10/13/23 #28 with 1 refill. Please approve or deny

## 2023-12-20 ENCOUNTER — Ambulatory Visit: Payer: PRIVATE HEALTH INSURANCE | Admitting: Obstetrics and Gynecology

## 2023-12-22 ENCOUNTER — Ambulatory Visit: Payer: PRIVATE HEALTH INSURANCE | Admitting: Obstetrics and Gynecology

## 2023-12-27 ENCOUNTER — Other Ambulatory Visit: Payer: Self-pay | Admitting: Nurse Practitioner

## 2023-12-27 DIAGNOSIS — N959 Unspecified menopausal and perimenopausal disorder: Secondary | ICD-10-CM

## 2023-12-28 ENCOUNTER — Other Ambulatory Visit: Payer: Self-pay

## 2023-12-28 DIAGNOSIS — N959 Unspecified menopausal and perimenopausal disorder: Secondary | ICD-10-CM

## 2023-12-28 MED ORDER — PROGESTERONE MICRONIZED 100 MG PO CAPS: 100.0000 mg | ORAL_CAPSULE | Freq: Every day | ORAL | 1 refills | Status: DC

## 2023-12-28 NOTE — Telephone Encounter (Signed)
 Medication refill request: progesterone   Last AEX:  11/23/22 Next AEX: 01/10/24 Last MMG (if hormonal medication request): 09/07/23 BIRADS Cat 1 neg  Refill authorized: #30 with 1 rf pended for today

## 2024-01-10 ENCOUNTER — Other Ambulatory Visit (HOSPITAL_COMMUNITY)
Admission: RE | Admit: 2024-01-10 | Discharge: 2024-01-10 | Disposition: A | Discharge status: Home / Self Care | Payer: PRIVATE HEALTH INSURANCE | Source: Ambulatory Visit | Attending: Obstetrics and Gynecology | Admitting: Obstetrics and Gynecology

## 2024-01-10 ENCOUNTER — Ambulatory Visit: Payer: PRIVATE HEALTH INSURANCE | Admitting: Obstetrics and Gynecology

## 2024-01-10 ENCOUNTER — Ambulatory Visit (INDEPENDENT_AMBULATORY_CARE_PROVIDER_SITE_OTHER): Payer: PRIVATE HEALTH INSURANCE | Admitting: Obstetrics and Gynecology

## 2024-01-10 ENCOUNTER — Encounter: Payer: Self-pay | Admitting: Obstetrics and Gynecology

## 2024-01-10 VITALS — BP 110/78 | HR 60 | Ht 63.0 in | Wt 135.6 lb

## 2024-01-10 DIAGNOSIS — Z01419 Encounter for gynecological examination (general) (routine) without abnormal findings: Secondary | ICD-10-CM

## 2024-01-10 DIAGNOSIS — E2839 Other primary ovarian failure: Secondary | ICD-10-CM

## 2024-01-10 DIAGNOSIS — Z9189 Other specified personal risk factors, not elsewhere classified: Secondary | ICD-10-CM

## 2024-01-10 DIAGNOSIS — N959 Unspecified menopausal and perimenopausal disorder: Secondary | ICD-10-CM | POA: Diagnosis not present

## 2024-01-10 DIAGNOSIS — Z1331 Encounter for screening for depression: Secondary | ICD-10-CM | POA: Diagnosis not present

## 2024-01-10 MED ORDER — PROGESTERONE MICRONIZED 100 MG PO CAPS
100.0000 mg | ORAL_CAPSULE | Freq: Every day | ORAL | 6 refills | Status: AC
Start: 2024-01-10 — End: ?

## 2024-01-10 MED ORDER — INTRAROSA 6.5 MG VA INST: 1.0000 | VAGINAL_INSERT | Freq: Every evening | VAGINAL | 12 refills | Status: AC | PRN

## 2024-01-10 MED ORDER — ESTRADIOL 0.0375 MG/24HR TD PTTW: 1.0000 | MEDICATED_PATCH | TRANSDERMAL | 12 refills | Status: AC

## 2024-01-10 NOTE — Patient Instructions (Addendum)
 Perimenopause/Menopause suggestions   You should be getting 1,200 mg of calcium a day between your diet and supplements and at least 3000 IU a day of Vit D. You should exercise regularly with weight bearing exercises. I would recommend a bone density q 2 years.  We loose 5% per year in this time period of our bone density, due to the loss of estrogen. Magnesium L-threonate at bedtime for our sleep and bones (follow recommended dose on bottle)  Please read The New Menopause my Dr. Ronal Estefana Morton and Estrogen Matters  Try to avoid caffeine and alcohol in this period, as they can make symptoms worse  Continue annual mammograms, unless told sooner  Please reach out with any questions Bethany Oliver  Locations you can schedule your bone scan are:  The [facility] bone scan location scheduling line is [phone]  [facility] is [phone]  [facility] radiology [phone]   [facility] [phone]  [facility] Bethany Oliver: [phone]    Another option if they are behind is [facility] and their number is [phone].  I can send the referral if you want to go there.    Please let me know if you have any trouble scheduling. This is important for management of osteoporosis or osteopenia.   I can sit down with you after the scan to discuss results and treatment.  Dr. Carpen

## 2024-01-10 NOTE — Progress Notes (Signed)
 53 y.o. y.o. female here for annual exam. Patient's last menstrual period was 09/21/2022 (exact date).     Dxa: mother with osteoporosis. Referral sent to [facility] 01/10/24 PAP-11/23/22-ASCUS H/o LEEP Mammo.-09/07/23 Colonoscopy-12/09/20  No PMB. Feels better on HRT.  Increased patch to .037 and prometrium  at bedtime. Combipatch  increased weight. Has vaginal dryness and dyspareunia some improvement with patch. To add intrarosa . She has known fibroids. Had [facility] 2024 with removal of cervical polyp Strong family history of osteoporosis. Takes vit D not calcium, no fractures. Works out. Cardiac calcium score: 01/10/24 referral placed   Narrative & Impression   Comparison is made with previous scan 10/11/19.  T/V images.  Anteverted uterus with two fibroids slightly larger than on the previous scan.  The overall uterine size is measured at 6.55 x 6.1 x 4.01 cm.  The largest fibroid is 3.6 x 2.7 cm and the smaller one is 1.5 x 1.66 cm. The endometrial line is measured at 5.0 mm, but the contour is distorted by the central fibroid.  Irregular cervical canal with a suspected mass 6 x 3 mm with a feeder vessel identified to this area.  Both ovaries are small with sparse follicles.  Previously seen right ovarian cyst is no longer present.  No adnexal mass.  No free fluid.       Body mass index is 24.02 kg/m.     01/10/2024    8:43 AM 12/10/2020    8:56 AM 10/26/2019    9:17 AM  Depression screen PHQ 2/9  Decreased Interest 0 0 0  Down, Depressed, Hopeless 0 0 0  PHQ - 2 Score 0 0 0  Altered sleeping  0 0  Tired, decreased energy  0 1  Change in appetite  0 1  Feeling bad or failure about yourself   0 0  Trouble concentrating  0 0  Moving slowly or fidgety/restless  0 0  Suicidal thoughts  0 0  PHQ-9 Score  0  2      Data saved with a previous flowsheet row definition    Blood pressure 110/78, pulse 60, height 5' 3 (1.6 m), weight 135 lb 9.6 oz (61.5 kg), last menstrual period  09/21/2022, SpO2 98%.     Component Value Date/Time   DIAGPAP (A) 11/23/2022 1529    - Atypical squamous cells of undetermined significance (ASC-US )   DIAGPAP  08/27/2020 1534    - Negative for intraepithelial lesion or malignancy (NILM)   HPVHIGH Negative 11/23/2022 1529   ADEQPAP  11/23/2022 1529    Satisfactory for evaluation; transformation zone component ABSENT.   ADEQPAP  08/27/2020 1534    Satisfactory for evaluation; transformation zone component PRESENT.    GYN HISTORY:    Component Value Date/Time   DIAGPAP (A) 11/23/2022 1529    - Atypical squamous cells of undetermined significance (ASC-US )   DIAGPAP  08/27/2020 1534    - Negative for intraepithelial lesion or malignancy (NILM)   HPVHIGH Negative 11/23/2022 1529   ADEQPAP  11/23/2022 1529    Satisfactory for evaluation; transformation zone component ABSENT.   ADEQPAP  08/27/2020 1534    Satisfactory for evaluation; transformation zone component PRESENT.    OB History  Gravida Para Term Preterm AB Living  1 0   1 0  SAB IAB Ectopic Multiple Live Births  1        # Outcome Date GA Lbr Len/2nd Weight Sex Type Anes PTL Lv  1 SAB  Past Medical History:  Diagnosis Date   Allergy    Basal cell carcinoma    Depression    mild, no hospitalization   Dry eyes    Ocular rosacea    Rosacea    STD (sexually transmitted disease)    HSV 1   Stricture esophagus     Past Surgical History:  Procedure Laterality Date   BASAL CELL CARCINOMA EXCISION     IVF  2008-20012   LASIK  05/2011   LEEP  1999    Current Outpatient Medications on File Prior to Visit  Medication Sig Dispense Refill   buPROPion  (WELLBUTRIN  XL) 150 MG 24 hr tablet Take 1 tablet (150 mg total) by mouth daily. 90 tablet 3   cetirizine (ZYRTEC) 10 MG tablet Take 10 mg by mouth daily.     Cholecalciferol (VITAMIN D -1000 MAX ST) 25 MCG (1000 UT) tablet Take by mouth.     cyanocobalamin (VITAMIN B12) 1000 MCG tablet Take 1 tablet by  mouth daily.     cycloSPORINE (RESTASIS) 0.05 % ophthalmic emulsion 1 drop 2 (two) times daily.     estradiol  (VIVELLE -DOT) 0.025 MG/24HR Place 1 patch onto the skin 2 (two) times a week. 8 patch 1   omeprazole (PRILOSEC) 40 MG capsule Take 40 mg by mouth daily.     progesterone  (PROMETRIUM ) 100 MG capsule Take 1 capsule (100 mg total) by mouth daily. 30 capsule 1   tretinoin (RETIN-A) 0.1 % cream Apply 1 Application topically at bedtime.     TYRVAYA 0.03 MG/ACT SOLN Place into both nostrils.     valACYclovir (VALTREX) 1000 MG tablet Take by mouth. (Patient taking differently: Take by mouth as needed.)     Ferrous Gluconate-C-Folic Acid  (IRON-C PO)  (Patient not taking: Reported on 01/10/2024)     No current facility-administered medications on file prior to visit.    Social History   Socioeconomic History   Marital status: Married    Spouse name: Not on file   Number of children: Not on file   Years of education: Not on file   Highest education level: Not on file  Occupational History   Not on file  Tobacco Use   Smoking status: Never   Smokeless tobacco: Never  Vaping Use   Vaping status: Never Used  Substance and Sexual Activity   Alcohol use: Yes    Comment: 2 drinks a week    Drug use: Never   Sexual activity: Yes    Partners: Male    Birth control/protection: None    Comment: 1st intercourse- 21, partners- more than 5  Other Topics Concern   Not on file  Social History Narrative   Work or School: works part time; nurse, adult      Home Situation: lives with husband whom is best boy and adopted child 2yo in 2015      Spiritual Beliefs: catholic      Lifestyle: does get regular CV exercise; diet is good            Social Drivers of Corporate Investment Banker Strain: Not on file  Food Insecurity: Low Risk  (04/25/2023)   Received from [facility]   Hunger Vital Sign    Within the past 12 months, you worried that your food would run out  before you got money to buy more: Never true    Within the past 12 months, the food you bought just didn't last and you didn't have money to get  more. : Never true  Transportation Needs: No Transportation Needs (04/25/2023)   Received from [facility]    In the past 12 months, has lack of reliable transportation kept you from medical appointments, meetings, work or from getting things needed for daily living? : No  Physical Activity: Not on file  Stress: Not on file  Social Connections: Unknown (07/02/2021)   Received from [facility]   Social Network    Social Network: Not on file  Intimate Partner Violence: Unknown (05/25/2021)   Received from [facility]   HITS    Physically Hurt: Not on file    Insult or Talk Down To: Not on file    Threaten Physical Harm: Not on file    Scream or Curse: Not on file    Family History  Problem Relation Age of Onset   Hypertension Mother    Parkinson's disease Father    Hyperlipidemia Father    Mental illness Father    Stroke Father    Anxiety disorder Sister    Bipolar disorder Brother    Breast cancer Paternal Aunt 30   Heart disease Maternal Grandmother    Asthma Maternal Grandmother    Diabetes Maternal Grandfather    Heart disease Maternal Grandfather    Heart disease Paternal Grandfather      No Known Allergies    Patient's last menstrual period was Patient's last menstrual period was 09/21/2022 (exact date)..            Review of Systems Alls systems reviewed and are negative.     Physical Exam Constitutional:      Appearance: Normal appearance.  Genitourinary:     Vulva and urethral meatus normal.     No lesions in the vagina.     Right Labia: No rash, lesions or skin changes.    Left Labia: No lesions, skin changes or rash.    No vaginal discharge or tenderness.     No vaginal prolapse present.    Mild vaginal atrophy present.     Right Adnexa: not tender, not palpable and no mass present.     Left Adnexa: not tender, not palpable and no mass present.    No cervical motion tenderness or discharge.     Uterus is not enlarged, tender or irregular.  Breasts:    Right: Normal.     Left: Normal.  HENT:     Head: Normocephalic.  Neck:     Thyroid : No thyroid  mass, thyromegaly or thyroid  tenderness.  Cardiovascular:     Rate and Rhythm: Normal rate and regular rhythm.     Heart sounds: Normal heart sounds, S1 normal and S2 normal.  Pulmonary:     Effort: Pulmonary effort is normal.     Breath sounds: Normal breath sounds and air entry.  Abdominal:     General: There is no distension.     Palpations: Abdomen is soft. There is no mass.     Tenderness: There is no abdominal tenderness. There is no guarding or rebound.  Musculoskeletal:        General: Normal range of motion.     Cervical back: Full passive range of motion without pain, normal range of motion and neck supple. No tenderness.     Right lower leg: No edema.     Left lower leg: No edema.  Neurological:     Mental Status: She is alert.  Skin:    General: Skin is warm.  Psychiatric:  Mood and Affect: Mood normal.        Behavior: Behavior normal.        Thought Content: Thought content normal.  Vitals and nursing note reviewed. Exam conducted with a chaperone present.       A:         Well Woman GYN exam             Vasomotor symptoms Dyspareunia, vaginal dryness                P:        Pap smear collected today Encouraged annual mammogram screening Colon cancer screening up-to-date DXA ordered today Labs and immunizations to do with PMD Increase patch to .037 with prometrium  at bedtime. Intrarosa for dyspareunia and vaginal dryness Encouraged healthy lifestyle practices Encouraged Vit D and Calcium   No follow-ups on file.  Bethany Oliver

## 2024-01-11 ENCOUNTER — Telehealth: Payer: Self-pay

## 2024-01-11 NOTE — Telephone Encounter (Signed)
 Prior authorization request received from covermymeds for Intrarosa .  PA initiated.  Patient notified.  KEY: [id number] DX: [id number] dyspareunia DX: N95.2 vaginal dryness

## 2024-01-11 NOTE — Telephone Encounter (Signed)
 Prior authorization for Intrarosa was approved.  Patient notified.

## 2024-01-12 ENCOUNTER — Ambulatory Visit: Payer: PRIVATE HEALTH INSURANCE | Admitting: Obstetrics and Gynecology

## 2024-01-16 ENCOUNTER — Ambulatory Visit: Payer: Self-pay | Admitting: Obstetrics and Gynecology

## 2024-01-16 LAB — CYTOLOGY - PAP
Adequacy: ABSENT
Diagnosis: NEGATIVE

## 2024-01-25 ENCOUNTER — Ambulatory Visit (HOSPITAL_BASED_OUTPATIENT_CLINIC_OR_DEPARTMENT_OTHER)
Admission: RE | Admit: 2024-01-25 | Discharge: 2024-01-25 | Disposition: A | Payer: PRIVATE HEALTH INSURANCE | Source: Ambulatory Visit | Attending: Obstetrics and Gynecology

## 2024-01-25 DIAGNOSIS — Z01419 Encounter for gynecological examination (general) (routine) without abnormal findings: Secondary | ICD-10-CM | POA: Insufficient documentation

## 2024-01-25 DIAGNOSIS — Z9189 Other specified personal risk factors, not elsewhere classified: Secondary | ICD-10-CM | POA: Insufficient documentation
